# Patient Record
Sex: Male | Born: 2000 | Race: White | Hispanic: No | Marital: Single | Smoking: Current some day smoker
Health system: Southern US, Community
[De-identification: ages and names within clinical notes are randomized; demographics above are authoritative.]

## PROBLEM LIST (undated history)

## (undated) ENCOUNTER — Ambulatory Visit: Admission: EM | Source: Home / Self Care

## (undated) DIAGNOSIS — J45909 Unspecified asthma, uncomplicated: Secondary | ICD-10-CM

## (undated) HISTORY — PX: NO PAST SURGERIES: SHX2092

## (undated) HISTORY — PX: TONSILLECTOMY: SUR1361

---
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
## 2018-04-09 ENCOUNTER — Ambulatory Visit
Admission: EM | Admit: 2018-04-09 | Discharge: 2018-04-09 | Disposition: A | Discharge status: Home / Self Care | Payer: Medicaid Other | Attending: Family Medicine | Admitting: Family Medicine

## 2018-04-09 ENCOUNTER — Other Ambulatory Visit: Payer: Self-pay

## 2018-04-09 DIAGNOSIS — J988 Other specified respiratory disorders: Secondary | ICD-10-CM

## 2018-04-09 DIAGNOSIS — J069 Acute upper respiratory infection, unspecified: Secondary | ICD-10-CM | POA: Diagnosis not present

## 2018-04-09 MED ORDER — BENZONATATE 100 MG PO CAPS: 100.0000 mg | ORAL_CAPSULE | Freq: Three times a day (TID) | ORAL | 0 refills | Status: DC | PRN

## 2018-04-09 MED ORDER — PREDNISONE 50 MG PO TABS: ORAL_TABLET | ORAL | 0 refills | Status: DC

## 2018-04-09 NOTE — Discharge Instructions (Signed)
This is viral and will slowly improve.  I have placed you on Prednisone and cough medication to help your symptoms.  Rest, fluids.  Take care  Dr. Adriana Simas

## 2018-04-09 NOTE — ED Triage Notes (Signed)
Pt with 2 weeks of what he thought was allergies but not going away. Cough worse first thing in the morning and at night. Scratchy and sore throat. Clear nasal drainage mostly but occasional yellow.

## 2018-04-09 NOTE — ED Provider Notes (Signed)
[facility]    CSN: [id number] Arrival date & time: 04/09/18  8295  History   Chief Complaint Chief Complaint  Patient presents with  . Cough   HPI  17 year old male presents with cough.  Patient states that he has been sick for the past 2 weeks.  He has had cough.  Cough seems to be worsening.  Associated sore throat, postnasal drip, headache.  No fever.  He thought that allergies may be contributing.  No known relieving factors.  No other associated symptoms.  No other complaints or concerns at this time.  Past Medical History:  Diagnosis Date  . Asthma    History reviewed. No pertinent surgical history.   Home Medications    Prior to Admission medications   Medication Sig Start Date End Date Taking? Authorizing Provider  benzonatate (TESSALON) 100 MG capsule Take 1 capsule (100 mg total) by mouth 3 (three) times daily as needed. 04/09/18   Tommie Sams, DO  predniSONE (DELTASONE) 50 MG tablet 1 tablet daily x 5 days. 04/09/18   Tommie Sams, DO   Family History Family History  Problem Relation Age of Onset  . Diabetes Mother   . Stroke Father    Social History Social History   Tobacco Use  . Smoking status: Current Every Day Smoker    Packs/day: 0.50    Types: Cigarettes  . Smokeless tobacco: Former Engineer, water Use Topics  . Alcohol use: No  . Drug use: No   Allergies   Patient has no known allergies.  Review of Systems Review of Systems  Constitutional: Negative for fever.  HENT: Positive for postnasal drip.   Respiratory: Positive for cough.   Neurological: Positive for headaches.   Physical Exam Triage Vital Signs ED Triage Vitals [04/09/18 0852]  Enc Vitals Group     BP 138/80     Pulse Rate (!) 48     Resp 16     Temp 98 F (36.7 C)     Temp Source Oral     SpO2 100 %     Weight 170 lb (77.1 kg)     Height 6' (1.829 m)     Head Circumference      Peak Flow      Pain Score 5     Pain Loc      Pain Edu?      Excl. in GC?     No data found.  Updated Vital Signs BP 138/80 (BP Location: Right Arm)   Pulse (!) 48   Temp 98 F (36.7 C) (Oral)   Resp 16   Ht 6' (1.829 m)   Wt 170 lb (77.1 kg)   SpO2 100%   BMI 23.06 kg/m     Physical Exam  Constitutional: He is oriented to person, place, and time. He appears well-developed. No distress.  HENT:  Oropharynx with mild erythema.  Eyes: Conjunctivae are normal. Right eye exhibits no discharge. Left eye exhibits no discharge.  Neck: Neck supple.  Cardiovascular: Regular rhythm.  Bradycardic.  Pulmonary/Chest: Effort normal and breath sounds normal.  Lymphadenopathy:    He has no cervical adenopathy.  Neurological: He is alert and oriented to person, place, and time.  Psychiatric: He has a normal mood and affect.  Nursing note and vitals reviewed.  UC Treatments / Results  Labs (all labs ordered are listed, but only abnormal results are displayed) Labs Reviewed - No data to display  EKG None  Radiology No  results found.  Procedures Procedures (including critical care time)  Medications Ordered in UC Medications - No data to display  Initial Impression / Assessment and Plan / UC Course  I have reviewed the triage vital signs and the nursing notes.  Pertinent labs & imaging results that were available during my care of the patient were reviewed by me and considered in my medical decision making (see chart for details).    17 year old male presents with a viral respiratory infection.  Treating with prednisone and Tessalon Perles.  Final Clinical Impressions(s) / UC Diagnoses   Final diagnoses:  Respiratory infection    ED Prescriptions    Medication Sig Dispense Auth. Provider   predniSONE (DELTASONE) 50 MG tablet 1 tablet daily x 5 days. 5 tablet Carisha Kantor G, DO   benzonatate (TESSALON) 100 MG capsule Take 1 capsule (100 mg total) by mouth 3 (three) times daily as needed. 30 capsule Tommie Sams, DO     Controlled Substance  Prescriptions [place] Controlled Substance Registry consulted? Not Applicable   Tommie Sams, DO 04/09/18 1003

## 2018-05-30 ENCOUNTER — Encounter: Payer: Self-pay | Admitting: Emergency Medicine

## 2018-05-30 ENCOUNTER — Ambulatory Visit
Admission: EM | Admit: 2018-05-30 | Discharge: 2018-05-30 | Disposition: A | Discharge status: Home / Self Care | Payer: Medicaid Other | Attending: Emergency Medicine | Admitting: Emergency Medicine

## 2018-05-30 ENCOUNTER — Other Ambulatory Visit: Payer: Self-pay

## 2018-05-30 DIAGNOSIS — R509 Fever, unspecified: Secondary | ICD-10-CM | POA: Diagnosis present

## 2018-05-30 DIAGNOSIS — F17210 Nicotine dependence, cigarettes, uncomplicated: Secondary | ICD-10-CM | POA: Diagnosis not present

## 2018-05-30 DIAGNOSIS — R6889 Other general symptoms and signs: Secondary | ICD-10-CM

## 2018-05-30 DIAGNOSIS — R52 Pain, unspecified: Secondary | ICD-10-CM | POA: Diagnosis present

## 2018-05-30 DIAGNOSIS — J45909 Unspecified asthma, uncomplicated: Secondary | ICD-10-CM | POA: Insufficient documentation

## 2018-05-30 DIAGNOSIS — J111 Influenza due to unidentified influenza virus with other respiratory manifestations: Secondary | ICD-10-CM | POA: Insufficient documentation

## 2018-05-30 DIAGNOSIS — R110 Nausea: Secondary | ICD-10-CM | POA: Diagnosis not present

## 2018-05-30 DIAGNOSIS — J029 Acute pharyngitis, unspecified: Secondary | ICD-10-CM | POA: Insufficient documentation

## 2018-05-30 LAB — RAPID INFLUENZA A&B ANTIGENS (ARMC ONLY)
INFLUENZA A (ARMC): NEGATIVE
INFLUENZA B (ARMC): NEGATIVE

## 2018-05-30 LAB — RAPID STREP SCREEN (MED CTR MEBANE ONLY): Streptococcus, Group A Screen (Direct): NEGATIVE

## 2018-05-30 MED ORDER — IBUPROFEN 600 MG PO TABS: 600.0000 mg | ORAL_TABLET | Freq: Four times a day (QID) | ORAL | 0 refills | Status: DC | PRN

## 2018-05-30 MED ORDER — OSELTAMIVIR PHOSPHATE 75 MG PO CAPS: 75.0000 mg | ORAL_CAPSULE | Freq: Two times a day (BID) | ORAL | 0 refills | Status: AC

## 2018-05-30 MED ORDER — IBUPROFEN 800 MG PO TABS
800.0000 mg | ORAL_TABLET | Freq: Once | ORAL | Status: AC
  Administered 2018-05-30: 800 mg via ORAL

## 2018-05-30 NOTE — ED Provider Notes (Signed)
[facility]    CSN: [id number] Arrival date & time: 05/30/18  1136     History   Chief Complaint Chief Complaint  Patient presents with  . Fever  . Generalized Body Aches  . Sore Throat    HPI Juan Hooper is a 17 y.o. male.   17 year old male presents with fever of 101/102, body aches, chills for the past 3 days. Also c/o of sore throat and throat congestion for the past 2 days along with nausea. Denies any cough, abdominal pain or vomiting. Had a few episodes of loose stools yesterday. He has not taken any medications for symptoms. He is accompanied today by his girlfriend and her 30 year old son has also been sick with 103/104 degree fevers, cough and congestion. She took him to the ER and was dx with ?type of infection and started on Amoxicillin and Ibuprofen/Tylenol. Otherwise he has no other chronic health issues. Does smoke cigarettes daily. Takes no daily medication.   The history is provided by the patient.    Past Medical History:  Diagnosis Date  . Asthma     There are no active problems to display for this patient.   History reviewed. No pertinent surgical history.     Home Medications    Prior to Admission medications   Medication Sig Start Date End Date Taking? Authorizing Provider  ibuprofen (ADVIL,MOTRIN) 600 MG tablet Take 1 tablet (600 mg total) by mouth every 6 (six) hours as needed for fever, headache or moderate pain. 05/30/18   Sudie Grumbling, NP  oseltamivir (TAMIFLU) 75 MG capsule Take 1 capsule (75 mg total) by mouth every 12 (twelve) hours for 5 days. 05/30/18 06/04/18  Sudie Grumbling, NP    Family History Family History  Problem Relation Age of Onset  . Diabetes Mother   . Stroke Father     Social History Social History   Tobacco Use  . Smoking status: Current Every Day Smoker    Packs/day: 0.50    Types: Cigarettes  . Smokeless tobacco: Former Engineer, water Use Topics  . Alcohol use: No  . Drug use: No      Allergies   Patient has no known allergies.   Review of Systems Review of Systems  Constitutional: Positive for activity change, appetite change, chills, fatigue and fever.  HENT: Positive for congestion (mild nasal), postnasal drip, rhinorrhea and sore throat. Negative for ear discharge, ear pain, facial swelling, hearing loss, mouth sores, nosebleeds, sinus pressure, sinus pain, sneezing and trouble swallowing.   Eyes: Negative for photophobia, pain, discharge, redness, itching and visual disturbance.  Respiratory: Negative for cough, chest tightness, shortness of breath and wheezing.   Gastrointestinal: Positive for diarrhea and nausea. Negative for abdominal pain and vomiting.  Genitourinary: Negative for decreased urine volume, difficulty urinating and dysuria.  Musculoskeletal: Positive for arthralgias, back pain, myalgias and neck pain. Negative for neck stiffness.  Skin: Negative for color change, rash and wound.  Allergic/Immunologic: Negative for immunocompromised state.  Neurological: Positive for headaches. Negative for dizziness, tremors, seizures, syncope, weakness, light-headedness and numbness.  Hematological: Negative for adenopathy. Does not bruise/bleed easily.     Physical Exam Triage Vital Signs ED Triage Vitals  Enc Vitals Group     BP 05/30/18 1203 123/84     Pulse Rate 05/30/18 1203 99     Resp 05/30/18 1203 16     Temp 05/30/18 1203 (!) 101 F (38.3 C)     Temp  Source 05/30/18 1203 Oral     SpO2 05/30/18 1203 100 %     Weight 05/30/18 1202 170 lb (77.1 kg)     Height 05/30/18 1202 6' (1.829 m)     Head Circumference --      Peak Flow --      Pain Score 05/30/18 1202 10     Pain Loc --      Pain Edu? --      Excl. in GC? --    No data found.  Updated Vital Signs BP 123/84 (BP Location: Left Arm)   Pulse 99   Temp (!) 101 F (38.3 C) (Oral)   Resp 16   Ht 6' (1.829 m)   Wt 170 lb (77.1 kg)   SpO2 100%   BMI 23.06 kg/m   Visual  Acuity Right Eye Distance:   Left Eye Distance:   Bilateral Distance:    Right Eye Near:   Left Eye Near:    Bilateral Near:     Physical Exam  Constitutional: He is oriented to person, place, and time. He appears well-developed and well-nourished. He appears ill. No distress.  Patient lying on exam table with girlfriend lying next to him in no acute distress but appears ill.   HENT:  Head: Normocephalic and atraumatic.  Right Ear: Hearing, tympanic membrane, external ear and ear canal normal.  Left Ear: Hearing, tympanic membrane, external ear and ear canal normal.  Nose: Rhinorrhea present. No mucosal edema. Right sinus exhibits no maxillary sinus tenderness and no frontal sinus tenderness. Left sinus exhibits no maxillary sinus tenderness and no frontal sinus tenderness.  Mouth/Throat: Uvula is midline and mucous membranes are normal. Oropharyngeal exudate (slight white to yellow drainage) and posterior oropharyngeal erythema present.  Eyes: Conjunctivae and EOM are normal.  Neck: Normal range of motion. Neck supple.  Cardiovascular: Normal rate, regular rhythm and normal heart sounds.  No murmur heard. Pulmonary/Chest: Effort normal and breath sounds normal. No respiratory distress. He has no decreased breath sounds. He has no wheezes. He has no rhonchi. He has no rales.  Abdominal: Soft. Normal appearance and bowel sounds are normal. He exhibits no distension and no mass. There is no hepatosplenomegaly. There is no tenderness.  Musculoskeletal: Normal range of motion.  Lymphadenopathy:    He has cervical adenopathy.       Right cervical: Superficial cervical adenopathy present.       Left cervical: Superficial cervical adenopathy present.  Neurological: He is alert and oriented to person, place, and time.  Skin: Skin is warm and dry. Capillary refill takes less than 2 seconds. No rash noted.  Psychiatric: He has a normal mood and affect. His speech is normal. Thought content  normal. He expresses inappropriate judgment.  Vitals reviewed.    UC Treatments / Results  Labs (all labs ordered are listed, but only abnormal results are displayed) Labs Reviewed  RAPID STREP SCREEN ([facility] ONLY)  RAPID INFLUENZA A&B ANTIGENS ([facility] ONLY)  CULTURE, GROUP A STREP [facility])    EKG None  Radiology No results found.  Procedures Procedures (including critical care time)  Medications Ordered in UC Medications  ibuprofen (ADVIL,MOTRIN) tablet 800 mg (800 mg Oral Given 05/30/18 1207)    Initial Impression / Assessment and Plan / UC Course  I have reviewed the triage vital signs and the nursing notes.  Pertinent labs & imaging results that were available during my care of the patient were reviewed by me and considered in my  medical decision making (see chart for details).    Reviewed negative rapid flu test and strep test with patient and girlfriend. Discussed that he may still have Influenza or flu-like illness. Reviewed benefits, risks with Tamiflu and patient desires to trial medication. Start Tamiflu 75mg  twice a day as directed. Continue Ibuprofen 600mg  every 6 hours as needed for fever and body aches. Continue to push fluids and eat frequent small meals. Note written for work. Follow-up with his PCP in 4 to 5 days if not improving.  Final Clinical Impressions(s) / UC Diagnoses   Final diagnoses:  Flu-like symptoms  Pharyngitis, unspecified etiology     Discharge Instructions     Recommend start Tamiflu 75mg  twice a day as directed. Continue Ibuprofen 600mg  every 6 hours as needed for pain and fever. Continue to push fluids and eat frequent small meals. Follow-up pending lab results and with your PCP in 4 to 5 days if not improving.     ED Prescriptions    Medication Sig Dispense Auth. Provider   oseltamivir (TAMIFLU) 75 MG capsule Take 1 capsule (75 mg total) by mouth every 12 (twelve) hours for 5 days. 10 capsule Sudie Grumbling, NP   ibuprofen  (ADVIL,MOTRIN) 600 MG tablet Take 1 tablet (600 mg total) by mouth every 6 (six) hours as needed for fever, headache or moderate pain. 20 tablet Sudie Grumbling, NP     Controlled Substance Prescriptions [place] Controlled Substance Registry consulted? Not Applicable   Sudie Grumbling, NP 05/30/18 2114

## 2018-05-30 NOTE — Discharge Instructions (Signed)
Recommend start Tamiflu 75mg  twice a day as directed. Continue Ibuprofen 600mg  every 6 hours as needed for pain and fever. Continue to push fluids and eat frequent small meals. Follow-up pending lab results and with your PCP in 4 to 5 days if not improving.

## 2018-05-30 NOTE — ED Triage Notes (Signed)
Patient c/o sore throat, fever and bodyaches that started 2 days ago.

## 2018-06-02 LAB — CULTURE, GROUP A STREP (THRC)

## 2018-06-07 ENCOUNTER — Other Ambulatory Visit: Payer: Self-pay

## 2018-06-07 ENCOUNTER — Encounter: Payer: Self-pay | Admitting: Physician Assistant

## 2018-06-07 ENCOUNTER — Emergency Department
Admission: EM | Admit: 2018-06-07 | Discharge: 2018-06-07 | Disposition: A | Discharge status: Home / Self Care | Payer: Medicaid Other | Attending: Emergency Medicine | Admitting: Emergency Medicine

## 2018-06-07 ENCOUNTER — Emergency Department: Payer: Medicaid Other

## 2018-06-07 DIAGNOSIS — W2209XA Striking against other stationary object, initial encounter: Secondary | ICD-10-CM | POA: Insufficient documentation

## 2018-06-07 DIAGNOSIS — Y9389 Activity, other specified: Secondary | ICD-10-CM | POA: Diagnosis not present

## 2018-06-07 DIAGNOSIS — Y929 Unspecified place or not applicable: Secondary | ICD-10-CM | POA: Insufficient documentation

## 2018-06-07 DIAGNOSIS — J45909 Unspecified asthma, uncomplicated: Secondary | ICD-10-CM | POA: Insufficient documentation

## 2018-06-07 DIAGNOSIS — S6991XA Unspecified injury of right wrist, hand and finger(s), initial encounter: Secondary | ICD-10-CM | POA: Diagnosis present

## 2018-06-07 DIAGNOSIS — Y999 Unspecified external cause status: Secondary | ICD-10-CM | POA: Diagnosis not present

## 2018-06-07 DIAGNOSIS — S62304A Unspecified fracture of fourth metacarpal bone, right hand, initial encounter for closed fracture: Secondary | ICD-10-CM | POA: Diagnosis not present

## 2018-06-07 DIAGNOSIS — F17210 Nicotine dependence, cigarettes, uncomplicated: Secondary | ICD-10-CM | POA: Diagnosis not present

## 2018-06-07 DIAGNOSIS — S62325A Displaced fracture of shaft of fourth metacarpal bone, left hand, initial encounter for closed fracture: Secondary | ICD-10-CM | POA: Diagnosis not present

## 2018-06-07 MED ORDER — TRAMADOL HCL 50 MG PO TABS
50.0000 mg | ORAL_TABLET | Freq: Once | ORAL | Status: AC
  Administered 2018-06-07: 50 mg via ORAL

## 2018-06-07 MED ORDER — TRAMADOL HCL 50 MG PO TABS
ORAL_TABLET | ORAL | Status: AC
  Administered 2018-06-07: 50 mg via ORAL
  Filled 2018-06-07: qty 1

## 2018-06-07 MED ORDER — TRAMADOL HCL 50 MG PO TABS: 50.0000 mg | ORAL_TABLET | Freq: Four times a day (QID) | ORAL | 0 refills | Status: DC | PRN

## 2018-06-07 NOTE — ED Provider Notes (Signed)
[facility] Emergency Department Provider Note  ____________________________________________   First MD Initiated Contact with Patient 06/07/18 2044     (approximate)  I have reviewed the triage vital signs and the nursing notes.   HISTORY  Chief Complaint Hand Pain    HPI Juan Hooper is a 17 y.o. male presents emergency department complaint of left hand pain.  He states he got mad when his girlfriend told him she was going to have an abortion and punched a pole.  He is left-handed.  He has no other complaints.  Past Medical History:  Diagnosis Date  . Asthma     There are no active problems to display for this patient.   History reviewed. No pertinent surgical history.  Prior to Admission medications   Medication Sig Start Date End Date Taking? Authorizing Provider  ibuprofen (ADVIL,MOTRIN) 600 MG tablet Take 1 tablet (600 mg total) by mouth every 6 (six) hours as needed for fever, headache or moderate pain. 05/30/18   Sudie GrumblingAmyot, Ann Berry, NP  traMADol (ULTRAM) 50 MG tablet Take 1 tablet (50 mg total) by mouth every 6 (six) hours as needed. 06/07/18   Faythe GheeFisher, Susan W, PA-C    Allergies Patient has no known allergies.  Family History  Problem Relation Age of Onset  . Diabetes Mother   . Stroke Father     Social History Social History   Tobacco Use  . Smoking status: Current Every Day Smoker    Packs/day: 0.50    Types: Cigarettes  . Smokeless tobacco: Former Engineer, waterUser  Substance Use Topics  . Alcohol use: No  . Drug use: No    Review of Systems  Constitutional: No fever/chills Eyes: No visual changes. ENT: No sore throat. Respiratory: Denies cough Genitourinary: Negative for dysuria. Musculoskeletal: Negative for back pain.  Positive for left hand pain Skin: Negative for rash.    ____________________________________________   PHYSICAL EXAM:  VITAL SIGNS: ED Triage Vitals  Enc Vitals Group     BP 06/07/18 2046 (!) 146/79       Pulse Rate 06/07/18 2046 86     Resp 06/07/18 2046 18     Temp 06/07/18 2046 98.1 F (36.7 C)     Temp Source 06/07/18 2046 Oral     SpO2 06/07/18 2046 99 %     Weight 06/07/18 2047 175 lb (79.4 kg)     Height 06/07/18 2047 6' (1.829 m)     Head Circumference --      Peak Flow --      Pain Score 06/07/18 2047 10     Pain Loc --      Pain Edu? --      Excl. in GC? --     Constitutional: Alert and oriented. Well appearing and in no acute distress. Eyes: Conjunctivae are normal.  Head: Atraumatic. Nose: No congestion/rhinnorhea. Mouth/Throat: Mucous membranes are moist.   Cardiovascular: Normal rate, regular rhythm. Respiratory: Normal respiratory effort.  No retractions GU: deferred Musculoskeletal: The left hand is tender and swollen at the fourth and fifth metacarpals.  There is a lot of swelling and questionable deformity in same area.  Patient is able to move all fingers and is neurovascularly intact.  Skin is intact also. Neurologic:  Normal speech and language.  Skin:  Skin is warm, dry and intact. No rash noted. Psychiatric: Mood and affect are normal. Speech and behavior are normal.  ____________________________________________   LABS (all labs ordered are listed, but only abnormal  results are displayed)  Labs Reviewed - No data to display ____________________________________________   ____________________________________________  RADIOLOGY  X-ray of the left hand shows a fourth metacarpal fracture which is displaced and angulated at 60 degrees  ____________________________________________   PROCEDURES  Procedure(s) performed:   .Splint Application Date/Time: 06/07/2018 9:35 PM Performed by: Alva Garnet, NT Authorized by: Faythe Ghee, PA-C   Consent:    Consent obtained:  Verbal   Consent given by:  Patient   Risks discussed:  Discoloration, numbness, pain and swelling   Alternatives discussed:  No treatment Pre-procedure details:     Sensation:  Normal Procedure details:    Laterality:  Left   Location:  Hand   Hand:  L hand   Splint type:  Ulnar gutter   Supplies:  Ortho-Glass Post-procedure details:    Pain:  Unchanged   Sensation:  Normal   Patient tolerance of procedure:  Tolerated well, no immediate complications      ____________________________________________   INITIAL IMPRESSION / ASSESSMENT AND PLAN / ED COURSE  Pertinent labs & imaging results that were available during my care of the patient were reviewed by me and considered in my medical decision making (see chart for details).  Patient is 17 year old male presents emergency department complaining of left hand pain after punching a pole.  On physical exam the left hand is tender and swollen with questionable deformity around the fourth and fifth metacarpal.  X-ray of the left hand shows a fourth metacarpal fracture which is angulated and displaced by 60 degrees  Discussed the x-ray findings with patient.  He was put in a ulnar gutter OCL.  He is to call Dr. Eliane Decree office tomorrow for an appointment.  He was given tramadol 50 mg p.o. while in the ED for pain.  He is to take Tylenol and ibuprofen for pain and tramadol for pain is not controlled by this.  He is to elevate and ice the extremity.  He states he understands comply with our instructions.  Was discharged in stable condition with this mother.     As part of my medical decision making, I reviewed the following data within the electronic MEDICAL RECORD NUMBER Nursing notes reviewed and incorporated, Old chart reviewed, Radiograph reviewed x-ray of the left hand shows a metacarpal fracture at the fourth metacarpal, Notes from prior ED visits and [place] Controlled Substance Database  ____________________________________________   FINAL CLINICAL IMPRESSION(S) / ED DIAGNOSES  Final diagnoses:  Closed displaced fracture of fourth metacarpal bone of right hand, unspecified portion of metacarpal,  initial encounter      NEW MEDICATIONS STARTED DURING THIS VISIT:  New Prescriptions   TRAMADOL (ULTRAM) 50 MG TABLET    Take 1 tablet (50 mg total) by mouth every 6 (six) hours as needed.     Note:  This document was prepared using Dragon voice recognition software and may include unintentional dictation errors.    Faythe Ghee, PA-C 06/07/18 2136    Sharyn Creamer, MD 06/08/18 [phone]

## 2018-06-07 NOTE — ED Triage Notes (Signed)
Punched a pole with his left hand. Swollen and painful. CMS intact.

## 2018-06-07 NOTE — Discharge Instructions (Addendum)
Follow-up with Dr. Allena KatzPatel.  Call his office for an appointment.  Tell them you were seen in the emergency department and you have a fourth metacarpal fracture which is displaced.  Take the pain medication of Tylenol and ibuprofen are not helping.  Keep the hand elevated and iced as much as possible.  Return to the emergency department if worsening

## 2018-06-12 DIAGNOSIS — S62325A Displaced fracture of shaft of fourth metacarpal bone, left hand, initial encounter for closed fracture: Secondary | ICD-10-CM | POA: Diagnosis not present

## 2018-06-14 ENCOUNTER — Encounter: Payer: Self-pay | Admitting: *Deleted

## 2018-06-14 ENCOUNTER — Other Ambulatory Visit: Payer: Self-pay

## 2018-06-14 ENCOUNTER — Ambulatory Visit: Payer: Medicaid Other | Admitting: Anesthesiology

## 2018-06-14 ENCOUNTER — Ambulatory Visit
Admission: RE | Admit: 2018-06-14 | Discharge: 2018-06-14 | Disposition: A | Discharge status: Home / Self Care | Payer: Medicaid Other | Source: Ambulatory Visit | Attending: Orthopedic Surgery | Admitting: Orthopedic Surgery

## 2018-06-14 ENCOUNTER — Ambulatory Visit: Payer: Medicaid Other

## 2018-06-14 ENCOUNTER — Encounter
Admission: RE | Disposition: A | Discharge status: Home / Self Care | Payer: Self-pay | Source: Ambulatory Visit | Attending: Orthopedic Surgery

## 2018-06-14 DIAGNOSIS — J45909 Unspecified asthma, uncomplicated: Secondary | ICD-10-CM | POA: Insufficient documentation

## 2018-06-14 DIAGNOSIS — S62325A Displaced fracture of shaft of fourth metacarpal bone, left hand, initial encounter for closed fracture: Secondary | ICD-10-CM | POA: Insufficient documentation

## 2018-06-14 DIAGNOSIS — F319 Bipolar disorder, unspecified: Secondary | ICD-10-CM | POA: Diagnosis not present

## 2018-06-14 DIAGNOSIS — Z79899 Other long term (current) drug therapy: Secondary | ICD-10-CM | POA: Insufficient documentation

## 2018-06-14 DIAGNOSIS — Z8781 Personal history of (healed) traumatic fracture: Secondary | ICD-10-CM

## 2018-06-14 DIAGNOSIS — W2189XA Striking against or struck by other sports equipment, initial encounter: Secondary | ICD-10-CM | POA: Insufficient documentation

## 2018-06-14 DIAGNOSIS — F909 Attention-deficit hyperactivity disorder, unspecified type: Secondary | ICD-10-CM | POA: Diagnosis not present

## 2018-06-14 DIAGNOSIS — Y9367 Activity, basketball: Secondary | ICD-10-CM | POA: Diagnosis not present

## 2018-06-14 DIAGNOSIS — Z9889 Other specified postprocedural states: Secondary | ICD-10-CM

## 2018-06-14 DIAGNOSIS — M79642 Pain in left hand: Secondary | ICD-10-CM | POA: Diagnosis not present

## 2018-06-14 HISTORY — PX: OPEN REDUCTION INTERNAL FIXATION (ORIF) METACARPAL: SHX6234

## 2018-06-14 SURGERY — OPEN REDUCTION INTERNAL FIXATION (ORIF) METACARPAL
Anesthesia: General | Laterality: Left

## 2018-06-14 MED ORDER — ACETAMINOPHEN 10 MG/ML IV SOLN
INTRAVENOUS | Status: DC | PRN
  Administered 2018-06-14: 1000 mg via INTRAVENOUS

## 2018-06-14 MED ORDER — MIDAZOLAM HCL 2 MG/2ML IJ SOLN
INTRAMUSCULAR | Status: AC
  Filled 2018-06-14: qty 2

## 2018-06-14 MED ORDER — HYDROCODONE-ACETAMINOPHEN 5-325 MG PO TABS
1.0000 | ORAL_TABLET | ORAL | Status: DC | PRN
  Administered 2018-06-14: 1 via ORAL

## 2018-06-14 MED ORDER — ONDANSETRON HCL 4 MG/2ML IJ SOLN: 4.0000 mg | Freq: Four times a day (QID) | INTRAMUSCULAR | Status: DC | PRN

## 2018-06-14 MED ORDER — ONDANSETRON HCL 4 MG PO TABS: 4.0000 mg | ORAL_TABLET | Freq: Four times a day (QID) | ORAL | Status: DC | PRN

## 2018-06-14 MED ORDER — ACETAMINOPHEN 10 MG/ML IV SOLN
INTRAVENOUS | Status: AC
  Filled 2018-06-14: qty 100

## 2018-06-14 MED ORDER — BUPIVACAINE HCL (PF) 0.5 % IJ SOLN
INTRAMUSCULAR | Status: DC | PRN
  Administered 2018-06-14: 10 mL

## 2018-06-14 MED ORDER — GLYCOPYRROLATE 0.2 MG/ML IJ SOLN
INTRAMUSCULAR | Status: AC
  Filled 2018-06-14: qty 1

## 2018-06-14 MED ORDER — LIDOCAINE HCL (PF) 2 % IJ SOLN
INTRAMUSCULAR | Status: AC
  Filled 2018-06-14: qty 10

## 2018-06-14 MED ORDER — HYDROCODONE-ACETAMINOPHEN 5-325 MG PO TABS
ORAL_TABLET | ORAL | Status: AC
  Filled 2018-06-14: qty 1

## 2018-06-14 MED ORDER — DEXAMETHASONE SODIUM PHOSPHATE 10 MG/ML IJ SOLN
INTRAMUSCULAR | Status: AC
  Filled 2018-06-14: qty 1

## 2018-06-14 MED ORDER — FENTANYL CITRATE (PF) 100 MCG/2ML IJ SOLN
INTRAMUSCULAR | Status: AC
  Filled 2018-06-14: qty 2

## 2018-06-14 MED ORDER — BUPIVACAINE HCL (PF) 0.5 % IJ SOLN
INTRAMUSCULAR | Status: AC
  Filled 2018-06-14: qty 30

## 2018-06-14 MED ORDER — DEXAMETHASONE SODIUM PHOSPHATE 10 MG/ML IJ SOLN
INTRAMUSCULAR | Status: DC | PRN
  Administered 2018-06-14 (×2): 10 mg via INTRAVENOUS

## 2018-06-14 MED ORDER — FENTANYL CITRATE (PF) 100 MCG/2ML IJ SOLN
INTRAMUSCULAR | Status: AC
  Administered 2018-06-14: 25 ug via INTRAVENOUS
  Filled 2018-06-14: qty 2

## 2018-06-14 MED ORDER — ONDANSETRON HCL 4 MG/2ML IJ SOLN: 4.0000 mg | Freq: Once | INTRAMUSCULAR | Status: DC | PRN

## 2018-06-14 MED ORDER — CEFAZOLIN SODIUM-DEXTROSE 2-4 GM/100ML-% IV SOLN
INTRAVENOUS | Status: AC
  Filled 2018-06-14: qty 100

## 2018-06-14 MED ORDER — LACTATED RINGERS IV SOLN
INTRAVENOUS | Status: DC
  Administered 2018-06-14: 10:00:00 via INTRAVENOUS

## 2018-06-14 MED ORDER — METOCLOPRAMIDE HCL 5 MG/ML IJ SOLN: 5.0000 mg | Freq: Three times a day (TID) | INTRAMUSCULAR | Status: DC | PRN

## 2018-06-14 MED ORDER — MIDAZOLAM HCL 2 MG/2ML IJ SOLN
INTRAMUSCULAR | Status: DC | PRN
  Administered 2018-06-14: 2 mg via INTRAVENOUS

## 2018-06-14 MED ORDER — PROPOFOL 10 MG/ML IV BOLUS
INTRAVENOUS | Status: DC | PRN
  Administered 2018-06-14: 200 mg via INTRAVENOUS

## 2018-06-14 MED ORDER — ONDANSETRON HCL 4 MG/2ML IJ SOLN
INTRAMUSCULAR | Status: AC
  Filled 2018-06-14: qty 2

## 2018-06-14 MED ORDER — ONDANSETRON HCL 4 MG/2ML IJ SOLN
INTRAMUSCULAR | Status: DC | PRN
  Administered 2018-06-14 (×2): 4 mg via INTRAVENOUS

## 2018-06-14 MED ORDER — CEFAZOLIN SODIUM-DEXTROSE 2-4 GM/100ML-% IV SOLN
2.0000 g | Freq: Once | INTRAVENOUS | Status: AC
  Administered 2018-06-14: 2 g via INTRAVENOUS

## 2018-06-14 MED ORDER — METOCLOPRAMIDE HCL 10 MG PO TABS: 5.0000 mg | ORAL_TABLET | Freq: Three times a day (TID) | ORAL | Status: DC | PRN

## 2018-06-14 MED ORDER — HYDROCODONE-ACETAMINOPHEN 5-325 MG PO TABS: 1.0000 | ORAL_TABLET | Freq: Four times a day (QID) | ORAL | 0 refills | Status: DC | PRN

## 2018-06-14 MED ORDER — FENTANYL CITRATE (PF) 100 MCG/2ML IJ SOLN
25.0000 ug | INTRAMUSCULAR | Status: DC | PRN
  Administered 2018-06-14 (×2): 25 ug via INTRAVENOUS

## 2018-06-14 MED ORDER — NEOMYCIN-POLYMYXIN B GU 40-200000 IR SOLN
Status: DC | PRN
  Administered 2018-06-14: 2 mL

## 2018-06-14 MED ORDER — LIDOCAINE HCL (CARDIAC) PF 100 MG/5ML IV SOSY
PREFILLED_SYRINGE | INTRAVENOUS | Status: DC | PRN
  Administered 2018-06-14: 100 mg via INTRAVENOUS

## 2018-06-14 MED ORDER — PHENYLEPHRINE HCL 10 MG/ML IJ SOLN
INTRAMUSCULAR | Status: DC | PRN
  Administered 2018-06-14 (×2): 100 ug via INTRAVENOUS

## 2018-06-14 MED ORDER — SODIUM CHLORIDE 0.9 % IV SOLN: INTRAVENOUS | Status: DC

## 2018-06-14 MED ORDER — PROPOFOL 500 MG/50ML IV EMUL
INTRAVENOUS | Status: AC
  Filled 2018-06-14: qty 50

## 2018-06-14 MED ORDER — KETOROLAC TROMETHAMINE 30 MG/ML IJ SOLN
INTRAMUSCULAR | Status: DC | PRN
  Administered 2018-06-14: 30 mg via INTRAVENOUS

## 2018-06-14 MED ORDER — NEOMYCIN-POLYMYXIN B GU 40-200000 IR SOLN
Status: AC
  Filled 2018-06-14: qty 2

## 2018-06-14 MED ORDER — FENTANYL CITRATE (PF) 100 MCG/2ML IJ SOLN
INTRAMUSCULAR | Status: DC | PRN
  Administered 2018-06-14: 100 ug via INTRAVENOUS

## 2018-06-14 SURGICAL SUPPLY — 39 items
BANDAGE ELASTIC 4 LF NS (GAUZE/BANDAGES/DRESSINGS) ×3 IMPLANT
BIT DRILL 1.1 (BIT) ×1
BIT DRILL 1.1MM (BIT) ×1
BIT DRILL 60X20X1.1XQC TMX (BIT) IMPLANT
BIT DRL 60X20X1.1XQC TMX (BIT) ×1
BNDG GAUZE 1X2.1 STRL (MISCELLANEOUS) ×3 IMPLANT
CHLORAPREP W/TINT 26ML (MISCELLANEOUS) ×3 IMPLANT
CUFF TOURN 18 STER (MISCELLANEOUS) IMPLANT
DRAPE FLUOR MINI C-ARM 54X84 (DRAPES) ×3 IMPLANT
DRIVER BIT 1.5 (TRAUMA) ×4 IMPLANT
ELECT CAUTERY BLADE 6.4 (BLADE) ×3 IMPLANT
ELECT REM PT RETURN 9FT ADLT (ELECTROSURGICAL) ×3
ELECTRODE REM PT RTRN 9FT ADLT (ELECTROSURGICAL) ×1 IMPLANT
GAUZE PETRO XEROFOAM 1X8 (MISCELLANEOUS) ×3 IMPLANT
GAUZE SPONGE 4X4 12PLY STRL (GAUZE/BANDAGES/DRESSINGS) ×3 IMPLANT
GAUZE XEROFORM 4X4 STRL (GAUZE/BANDAGES/DRESSINGS) ×2 IMPLANT
GLOVE SURG SYN 9.0  PF PI (GLOVE) ×2
GLOVE SURG SYN 9.0 PF PI (GLOVE) ×1 IMPLANT
GOWN SRG 2XL LVL 4 RGLN SLV (GOWNS) ×1 IMPLANT
GOWN STRL NON-REIN 2XL LVL4 (GOWNS) ×2
GOWN STRL REUS W/ TWL LRG LVL3 (GOWN DISPOSABLE) ×1 IMPLANT
GOWN STRL REUS W/TWL LRG LVL3 (GOWN DISPOSABLE) ×2
K-WIRE DBL TRONS .035X6 (WIRE) ×3
KIT TURNOVER KIT A (KITS) ×3 IMPLANT
KWIRE DBL TRONS .035X6 (WIRE) IMPLANT
NDL FILTER BLUNT 18X1 1/2 (NEEDLE) ×1 IMPLANT
NEEDLE FILTER BLUNT 18X 1/2SAF (NEEDLE) ×2
NEEDLE FILTER BLUNT 18X1 1/2 (NEEDLE) ×1 IMPLANT
NS IRRIG 500ML POUR BTL (IV SOLUTION) ×3 IMPLANT
PACK EXTREMITY ARMC (MISCELLANEOUS) ×3 IMPLANT
PAD PREP 24X41 OB/GYN DISP (PERSONAL CARE ITEMS) ×3 IMPLANT
PADDING CAST BLEND 4X4 NS (MISCELLANEOUS) ×3 IMPLANT
PLATE STRAIGHT LOCK 1.5 (Plate) ×2 IMPLANT
SCALPEL PROTECTED #15 DISP (BLADE) ×6 IMPLANT
SCREW NL 1.5X11 WRIST (Screw) ×6 IMPLANT
SCREW NL 1.5X12 (Screw) ×2 IMPLANT
SCREW NONIOC 1.5 10M (Screw) ×4 IMPLANT
SUT ETHIBOND 4-0 (SUTURE) ×3 IMPLANT
SUT ETHILON 5 0 CL P 3 (SUTURE) ×3 IMPLANT

## 2018-06-14 NOTE — Anesthesia Preprocedure Evaluation (Signed)
Anesthesia Evaluation  Patient identified by MRN, date of birth, ID band Patient awake    Reviewed: Allergy & Precautions, NPO status , Patient's Chart, lab work & pertinent test results  History of Anesthesia Complications Negative for: history of anesthetic complications  Airway Mallampati: II       Dental   Pulmonary asthma (hx) , neg sleep apnea, neg COPD, Current Smoker,           Cardiovascular (-) hypertension(-) Past MI and (-) CHF (-) dysrhythmias (-) Valvular Problems/Murmurs     Neuro/Psych neg Seizures    GI/Hepatic Neg liver ROS, neg GERD  ,  Endo/Other  neg diabetes  Renal/GU negative Renal ROS     Musculoskeletal   Abdominal   Peds  Hematology   Anesthesia Other Findings   Reproductive/Obstetrics                             Anesthesia Physical Anesthesia Plan  ASA: II  Anesthesia Plan: General   Post-op Pain Management:    Induction: Intravenous  PONV Risk Score and Plan: 1 and Ondansetron  Airway Management Planned: LMA  Additional Equipment:   Intra-op Plan:   Post-operative Plan:   Informed Consent: I have reviewed the patients History and Physical, chart, labs and discussed the procedure including the risks, benefits and alternatives for the proposed anesthesia with the patient or authorized representative who has indicated his/her understanding and acceptance.     Plan Discussed with:   Anesthesia Plan Comments:         Anesthesia Quick Evaluation

## 2018-06-14 NOTE — Transfer of Care (Signed)
Immediate Anesthesia Transfer of Care Note  Patient: Juan Hooper  Procedure(s) Performed: OPEN REDUCTION INTERNAL FIXATION (ORIF) METACARPAL (Left )  Patient Location: PACU  Anesthesia Type:General  Level of Consciousness: awake, alert  and oriented  Airway & Oxygen Therapy: Patient Spontanous Breathing and Patient connected to face mask oxygen  Post-op Assessment: Report given to RN and Post -op Vital signs reviewed and stable  Post vital signs: Reviewed and stable  Last Vitals:  Vitals Value Taken Time  BP    Temp    Pulse 55 06/14/2018 12:56 PM  Resp    SpO2 100 % 06/14/2018 12:56 PM  Vitals shown include unvalidated device data.  Last Pain:  Vitals:   06/14/18 0934  TempSrc: Temporal  PainSc: 6          Complications: No apparent anesthesia complications

## 2018-06-14 NOTE — Anesthesia Post-op Follow-up Note (Signed)
Anesthesia QCDR form completed.        

## 2018-06-14 NOTE — Discharge Instructions (Addendum)
Work on finger range of motion but do not try to lift with the left hand.  Keep dressing clean and dry.  Keep hand elevated as much as possible.    AMBULATORY SURGERY  DISCHARGE INSTRUCTIONS   1) The drugs that you were given will stay in your system until tomorrow so for the next 24 hours you should not:  A) Drive an automobile B) Make any legal decisions C) Drink any alcoholic beverage   2) You may resume regular meals tomorrow.  Today it is better to start with liquids and gradually work up to solid foods.  You may eat anything you prefer, but it is better to start with liquids, then soup and crackers, and gradually work up to solid foods.   3) Please notify your doctor immediately if you have any unusual bleeding, trouble breathing, redness and pain at the surgery site, drainage, fever, or pain not relieved by medication.    4) Additional Instructions:        Please contact your physician with any problems or Same Day Surgery at [phone], Monday through Friday 6 am to 4 pm, or [place] at [facility] Main number at [phone].

## 2018-06-14 NOTE — Anesthesia Postprocedure Evaluation (Signed)
Anesthesia Post Note  Patient: Harland DingwallCody L Haverstock  Procedure(s) Performed: OPEN REDUCTION INTERNAL FIXATION (ORIF) METACARPAL (Left )  Patient location during evaluation: PACU Anesthesia Type: General Level of consciousness: awake and alert Pain management: pain level controlled Vital Signs Assessment: post-procedure vital signs reviewed and stable Respiratory status: spontaneous breathing and respiratory function stable Cardiovascular status: stable Anesthetic complications: no     Last Vitals:  Vitals:   06/14/18 1358 06/14/18 1420  BP: 120/75 122/78  Pulse:    Resp:    Temp: 36.5 C   SpO2:      Last Pain:  Vitals:   06/14/18 1425  TempSrc:   PainSc: 3                  KEPHART,WILLIAM K

## 2018-06-14 NOTE — Op Note (Signed)
06/14/2018  12:55 PM  PATIENT:  Juan Hooper  17 y.o. male  PRE-OPERATIVE DIAGNOSIS:  CLOSED DISPLACED FRACTUR EOF SHAFT OF FOURTH METACARPAL  POST-OPERATIVE DIAGNOSIS:  CLOSED DISPLACED FRACTUR EOF SHAFT OF FOURTH METACARPAL  PROCEDURE:  Procedure(s): OPEN REDUCTION INTERNAL FIXATION (ORIF) METACARPAL (Left)  SURGEON: Leitha SchullerMichael J Lougenia Morrissey, MD  ASSISTANTS: None  ANESTHESIA:   general  EBL:  Total I/O In: 600 [I.V.:600] Out: 5 [Blood:5]  BLOOD ADMINISTERED:none  DRAINS: none   LOCAL MEDICATIONS USED:  MARCAINE     SPECIMEN:  No Specimen  DISPOSITION OF SPECIMEN:  N/A  COUNTS:  YES  TOURNIQUET:  * Missing tourniquet times found for documented tourniquets in log: [id number] * 38 minutes at 250 mmHg  IMPLANTS: Biomet 1.5 mm plate with 6 screws, nonlocking  DICTATION: .Dragon Dictation patient brought the operating room and after adequate general anesthesia was obtained the left arm was prepped and draped you sterile fashion.  After patient identification and timeout procedure were completed tourniquet was raised.  Incision was made between the fourth and fifth metacarpals and the extensor tendon identified over the ring metacarpal and retracted radially.  The fracture site was identified and a subperiosteal dissection carried out to expose the fracture.  A 6-hole plate straight was then laid over the dorsum of this and proximal screws were filled 3 distal screws were placed but there seem to be a slight rotational deformity so the screws were removed with rotation reset.  Anatomic alignment was obtained with a straight transverse fracture some compression was applied to the fracture site.  After all screws were placed the position of the screws and plate were reviewed under the mini C-arm and appeared appropriate with appropriate screw length.  There did not appear to be rotatory or angulatory deformity to the metacarpal.  Wound was thoroughly irrigated and 10 cc of Marcaine was  infiltrated in the area the incision wound closed with simple interrupted 4-0 nylon skin sutures.  Xeroform 4 x 4 web roll and Ace wrap applied.  Get let down to close the case  PLAN OF CARE: Discharge to home after PACU  PATIENT DISPOSITION:  PACU - hemodynamically stable.

## 2018-06-14 NOTE — OR Nursing (Signed)
Discussed discharge instructions with pt and Mom. Both voice understanding. 

## 2018-06-14 NOTE — Anesthesia Procedure Notes (Signed)
Procedure Name: LMA Insertion Date/Time: 06/14/2018 11:55 AM Performed by: Junious SilkNoles, Kainen Struckman, CRNA Pre-anesthesia Checklist: Patient identified, Patient being monitored, Timeout performed, Emergency Drugs available and Suction available Patient Re-evaluated:Patient Re-evaluated prior to induction Oxygen Delivery Method: Circle system utilized Preoxygenation: Pre-oxygenation with 100% oxygen Induction Type: IV induction Ventilation: Mask ventilation without difficulty LMA: LMA inserted LMA Size: 4.0 Tube type: Oral Number of attempts: 1 Placement Confirmation: positive ETCO2 and breath sounds checked- equal and bilateral Tube secured with: Tape Dental Injury: Teeth and Oropharynx as per pre-operative assessment

## 2018-06-14 NOTE — H&P (Signed)
Reviewed paper H+P, will be scanned into chart. No changes noted.  

## 2018-06-15 ENCOUNTER — Encounter: Payer: Self-pay | Admitting: Orthopedic Surgery

## 2018-06-18 DIAGNOSIS — M25532 Pain in left wrist: Secondary | ICD-10-CM | POA: Diagnosis not present

## 2018-06-18 DIAGNOSIS — Z967 Presence of other bone and tendon implants: Secondary | ICD-10-CM | POA: Diagnosis not present

## 2018-06-18 DIAGNOSIS — Z8781 Personal history of (healed) traumatic fracture: Secondary | ICD-10-CM | POA: Diagnosis not present

## 2018-06-29 DIAGNOSIS — Z8781 Personal history of (healed) traumatic fracture: Secondary | ICD-10-CM | POA: Diagnosis not present

## 2018-06-29 DIAGNOSIS — Z9889 Other specified postprocedural states: Secondary | ICD-10-CM | POA: Diagnosis not present

## 2018-07-13 ENCOUNTER — Other Ambulatory Visit: Payer: Self-pay

## 2018-07-13 ENCOUNTER — Emergency Department
Admission: EM | Admit: 2018-07-13 | Discharge: 2018-07-13 | Disposition: A | Discharge status: Home / Self Care | Payer: Medicaid Other | Attending: Emergency Medicine | Admitting: Emergency Medicine

## 2018-07-13 DIAGNOSIS — J45909 Unspecified asthma, uncomplicated: Secondary | ICD-10-CM | POA: Insufficient documentation

## 2018-07-13 DIAGNOSIS — Y92017 Garden or yard in single-family (private) house as the place of occurrence of the external cause: Secondary | ICD-10-CM | POA: Diagnosis not present

## 2018-07-13 DIAGNOSIS — Y998 Other external cause status: Secondary | ICD-10-CM | POA: Diagnosis not present

## 2018-07-13 DIAGNOSIS — S0502XA Injury of conjunctiva and corneal abrasion without foreign body, left eye, initial encounter: Secondary | ICD-10-CM | POA: Diagnosis not present

## 2018-07-13 DIAGNOSIS — F17210 Nicotine dependence, cigarettes, uncomplicated: Secondary | ICD-10-CM | POA: Diagnosis not present

## 2018-07-13 DIAGNOSIS — Y93H2 Activity, gardening and landscaping: Secondary | ICD-10-CM | POA: Diagnosis not present

## 2018-07-13 DIAGNOSIS — W228XXA Striking against or struck by other objects, initial encounter: Secondary | ICD-10-CM | POA: Diagnosis not present

## 2018-07-13 DIAGNOSIS — S0592XA Unspecified injury of left eye and orbit, initial encounter: Secondary | ICD-10-CM | POA: Diagnosis present

## 2018-07-13 MED ORDER — TETRACAINE HCL 0.5 % OP SOLN
2.0000 [drp] | Freq: Once | OPHTHALMIC | Status: AC
  Administered 2018-07-13: 2 [drp] via OPHTHALMIC
  Filled 2018-07-13: qty 4

## 2018-07-13 MED ORDER — POLYMYXIN B-TRIMETHOPRIM 10000-0.1 UNIT/ML-% OP SOLN: 2.0000 [drp] | Freq: Four times a day (QID) | OPHTHALMIC | 0 refills | Status: DC

## 2018-07-13 MED ORDER — FLUORESCEIN SODIUM 1 MG OP STRP
1.0000 | ORAL_STRIP | Freq: Once | OPHTHALMIC | Status: AC
  Administered 2018-07-13: 1 via OPHTHALMIC
  Filled 2018-07-13: qty 1

## 2018-07-13 MED ORDER — KETOROLAC TROMETHAMINE 0.5 % OP SOLN: 1.0000 [drp] | Freq: Four times a day (QID) | OPHTHALMIC | 0 refills | Status: DC

## 2018-07-13 NOTE — ED Triage Notes (Signed)
Pt states that while he was weed eating something flew up and hit his left eye. Pt has rinsed it and states he feels like something is in it still, no obvious deformity noted at this time, pt able to move globe all around with ease

## 2018-07-13 NOTE — ED Notes (Signed)

## 2018-07-13 NOTE — ED Provider Notes (Signed)
[facility] Emergency Department Provider Note  ____________________________________________  Time seen: Approximately 4:13 PM  I have reviewed the triage vital signs and the nursing notes.   HISTORY  Chief Complaint Eye Injury    HPI Juan Hooper is a 17 y.o. male who presents the emergency department complaining of left eye foreign body sensation.  Patient was using a weed eater and wearing glasses when something flew behind the glasses and hit his left eye.  Patient does not wear corrective lenses including contacts.  Patient rinsed out his eye with water prior to arrival but continues to experience foreign body sensation.  No visual changes or vision loss.  No other injury or complaint at this time.    Past Medical History:  Diagnosis Date  . Asthma     There are no active problems to display for this patient.   Past Surgical History:  Procedure Laterality Date  . NO PAST SURGERIES    . OPEN REDUCTION INTERNAL FIXATION (ORIF) METACARPAL Left 06/14/2018   Procedure: OPEN REDUCTION INTERNAL FIXATION (ORIF) METACARPAL;  Surgeon: Kennedy Bucker, MD;  Location: [facility] ORS;  Service: Orthopedics;  Laterality: Left;    Prior to Admission medications   Medication Sig Start Date End Date Taking? Authorizing Provider  acetaminophen (TYLENOL) 325 MG tablet Take 650 mg by mouth every 6 (six) hours as needed.    [provider]  HYDROcodone-acetaminophen (NORCO) 5-325 MG tablet Take 1 tablet by mouth every 6 (six) hours as needed for moderate pain. 06/14/18   Kennedy Bucker, MD  ibuprofen (ADVIL,MOTRIN) 600 MG tablet Take 1 tablet (600 mg total) by mouth every 6 (six) hours as needed for fever, headache or moderate pain. 05/30/18   Sudie Grumbling, NP  ketorolac (ACULAR) 0.5 % ophthalmic solution Place 1 drop into the left eye 4 (four) times daily. 07/13/18   Savera Donson, Delorise Royals, PA-C  traMADol (ULTRAM) 50 MG tablet Take 1 tablet (50 mg total) by mouth every  6 (six) hours as needed. 06/07/18   Fisher, Roselyn Bering, PA-C  trimethoprim-polymyxin b (POLYTRIM) ophthalmic solution Place 2 drops into the left eye every 6 (six) hours. 07/13/18   Raiford Fetterman, Delorise Royals, PA-C    Allergies Patient has no known allergies.  Family History  Problem Relation Age of Onset  . Diabetes Mother   . Stroke Father     Social History Social History   Tobacco Use  . Smoking status: Current Every Day Smoker    Packs/day: 0.50    Types: Cigarettes  . Smokeless tobacco: Former Engineer, water Use Topics  . Alcohol use: No  . Drug use: No     Review of Systems  Constitutional: No fever/chills Eyes: No visual changes. No discharge.  Positive for left eye injury/foreign body sensation ENT: No upper respiratory complaints. Cardiovascular: no chest pain. Respiratory: no cough. No SOB. Gastrointestinal: No abdominal pain.  No nausea, no vomiting.   Musculoskeletal: Negative for musculoskeletal pain. Skin: Negative for rash, abrasions, lacerations, ecchymosis. Neurological: Negative for headaches, focal weakness or numbness. 10-point ROS otherwise negative.  ____________________________________________   PHYSICAL EXAM:  VITAL SIGNS: ED Triage Vitals  Enc Vitals Group     BP 07/13/18 1546 (!) 147/70     Pulse Rate 07/13/18 1546 75     Resp 07/13/18 1546 18     Temp 07/13/18 1546 98.3 F (36.8 C)     Temp Source 07/13/18 1546 Oral     SpO2 07/13/18 1546 98 %  Weight 07/13/18 1547 170 lb (77.1 kg)     Height 07/13/18 1547 6' (1.829 m)     Head Circumference --      Peak Flow --      Pain Score 07/13/18 1547 10     Pain Loc --      Pain Edu? --      Excl. in [place]? --      Constitutional: Alert and oriented. Well appearing and in no acute distress. Eyes: Conjunctivae are normal. PERRL. EOMI. visualization of external eye reveals no foreign body, gross abnormality.  No subconjunctival hemorrhaging.  Funduscopic exam reveals good red reflex bilaterally,  vasculature and optic disc is appreciated with no abnormality.  Eyes anesthetized using tetracaine drops, fluorescein staining is applied.  2 areas of uptake in the 7 o'clock position are appreciated.  These are linear, very superficial.  No visible foreign body. Head: Atraumatic. ENT:      Ears:       Nose: No congestion/rhinnorhea.      Mouth/Throat: Mucous membranes are moist.  Neck: No stridor.    Cardiovascular: Normal rate, regular rhythm. Normal S1 and S2.  Good peripheral circulation. Respiratory: Normal respiratory effort without tachypnea or retractions. Lungs CTAB. Good air entry to the bases with no decreased or absent breath sounds. Musculoskeletal: Full range of motion to all extremities. No gross deformities appreciated. Neurologic:  Normal speech and language. No gross focal neurologic deficits are appreciated.  Skin:  Skin is warm, dry and intact. No rash noted. Psychiatric: Mood and affect are normal. Speech and behavior are normal. Patient exhibits appropriate insight and judgement.   ____________________________________________   LABS (all labs ordered are listed, but only abnormal results are displayed)  Labs Reviewed - No data to display ____________________________________________  EKG   ____________________________________________  RADIOLOGY   No results found.  ____________________________________________    PROCEDURES  Procedure(s) performed:    Procedures    Medications  tetracaine (PONTOCAINE) 0.5 % ophthalmic solution 2 drop (2 drops Left Eye Given by Other 07/13/18 1555)  fluorescein ophthalmic strip 1 strip (1 strip Left Eye Given by Other 07/13/18 1555)     ____________________________________________   INITIAL IMPRESSION / ASSESSMENT AND PLAN / ED COURSE  Pertinent labs & imaging results that were available during my care of the patient were reviewed by me and considered in my medical decision making (see chart for  details).  Review of the [place] CSRS was performed in accordance of the [facility] prior to dispensing any controlled drugs.      Patient's diagnosis is consistent with corneal abrasion.  Patient presented to the emergency department after something flew into his eye while reading.  Exam reveals 2 areas of uptake with floor seen staining consistent with superficial abrasions.  No visible foreign body.  Globe intact.  Patient will be placed on Polytrim and Acular for symptom improvement. he will follow-up with ophthalmology as needed.Patient is given ED precautions to return to the ED for any worsening or new symptoms.     ____________________________________________  FINAL CLINICAL IMPRESSION(S) / ED DIAGNOSES  Final diagnoses:  Abrasion of left cornea, initial encounter      NEW MEDICATIONS STARTED DURING THIS VISIT:  ED Discharge Orders         Ordered    trimethoprim-polymyxin b (POLYTRIM) ophthalmic solution  Every 6 hours     07/13/18 1619    ketorolac (ACULAR) 0.5 % ophthalmic solution  4 times daily     07/13/18 1619  This chart was dictated using voice recognition software/Dragon. Despite best efforts to proofread, errors can occur which can change the meaning. Any change was purely unintentional.    Racheal PatchesCuthriell, Mabelle Mungin D, PA-C 07/13/18 1619    Emily FilbertWilliams, Jameer Storie E, MD 07/13/18 [phone]

## 2018-12-25 DIAGNOSIS — R05 Cough: Secondary | ICD-10-CM | POA: Diagnosis not present

## 2018-12-25 DIAGNOSIS — J069 Acute upper respiratory infection, unspecified: Secondary | ICD-10-CM | POA: Diagnosis not present

## 2018-12-25 DIAGNOSIS — R51 Headache: Secondary | ICD-10-CM | POA: Diagnosis not present

## 2018-12-27 DIAGNOSIS — R1110 Vomiting, unspecified: Secondary | ICD-10-CM | POA: Diagnosis not present

## 2018-12-27 DIAGNOSIS — R05 Cough: Secondary | ICD-10-CM | POA: Diagnosis not present

## 2018-12-27 DIAGNOSIS — R51 Headache: Secondary | ICD-10-CM | POA: Diagnosis not present

## 2018-12-27 DIAGNOSIS — J069 Acute upper respiratory infection, unspecified: Secondary | ICD-10-CM | POA: Diagnosis not present

## 2019-02-26 DIAGNOSIS — F5104 Psychophysiologic insomnia: Secondary | ICD-10-CM | POA: Diagnosis not present

## 2019-02-26 DIAGNOSIS — Z0000 Encounter for general adult medical examination without abnormal findings: Secondary | ICD-10-CM | POA: Diagnosis not present

## 2019-02-26 DIAGNOSIS — J45909 Unspecified asthma, uncomplicated: Secondary | ICD-10-CM | POA: Diagnosis not present

## 2019-04-09 ENCOUNTER — Other Ambulatory Visit: Payer: Self-pay | Admitting: Family Medicine

## 2019-04-09 ENCOUNTER — Ambulatory Visit
Admission: RE | Admit: 2019-04-09 | Discharge: 2019-04-09 | Disposition: A | Discharge status: Home / Self Care | Payer: Medicaid Other | Attending: Family Medicine | Admitting: Family Medicine

## 2019-04-09 ENCOUNTER — Ambulatory Visit
Admission: RE | Admit: 2019-04-09 | Discharge: 2019-04-09 | Disposition: A | Discharge status: Home / Self Care | Payer: Medicaid Other | Source: Ambulatory Visit | Attending: Family Medicine | Admitting: Family Medicine

## 2019-04-09 ENCOUNTER — Other Ambulatory Visit: Payer: Self-pay

## 2019-04-09 DIAGNOSIS — M25511 Pain in right shoulder: Secondary | ICD-10-CM

## 2019-04-09 DIAGNOSIS — F5104 Psychophysiologic insomnia: Secondary | ICD-10-CM | POA: Diagnosis not present
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
## 2019-04-18 IMAGING — DX DG HAND 2V*L*
2 series · 2 of 2 positions shown · non-contrast
Comparison: 06/07/2018

CLINICAL DATA: Postop.

EXAM:
LEFT HAND - 2 VIEW

[hand lat]
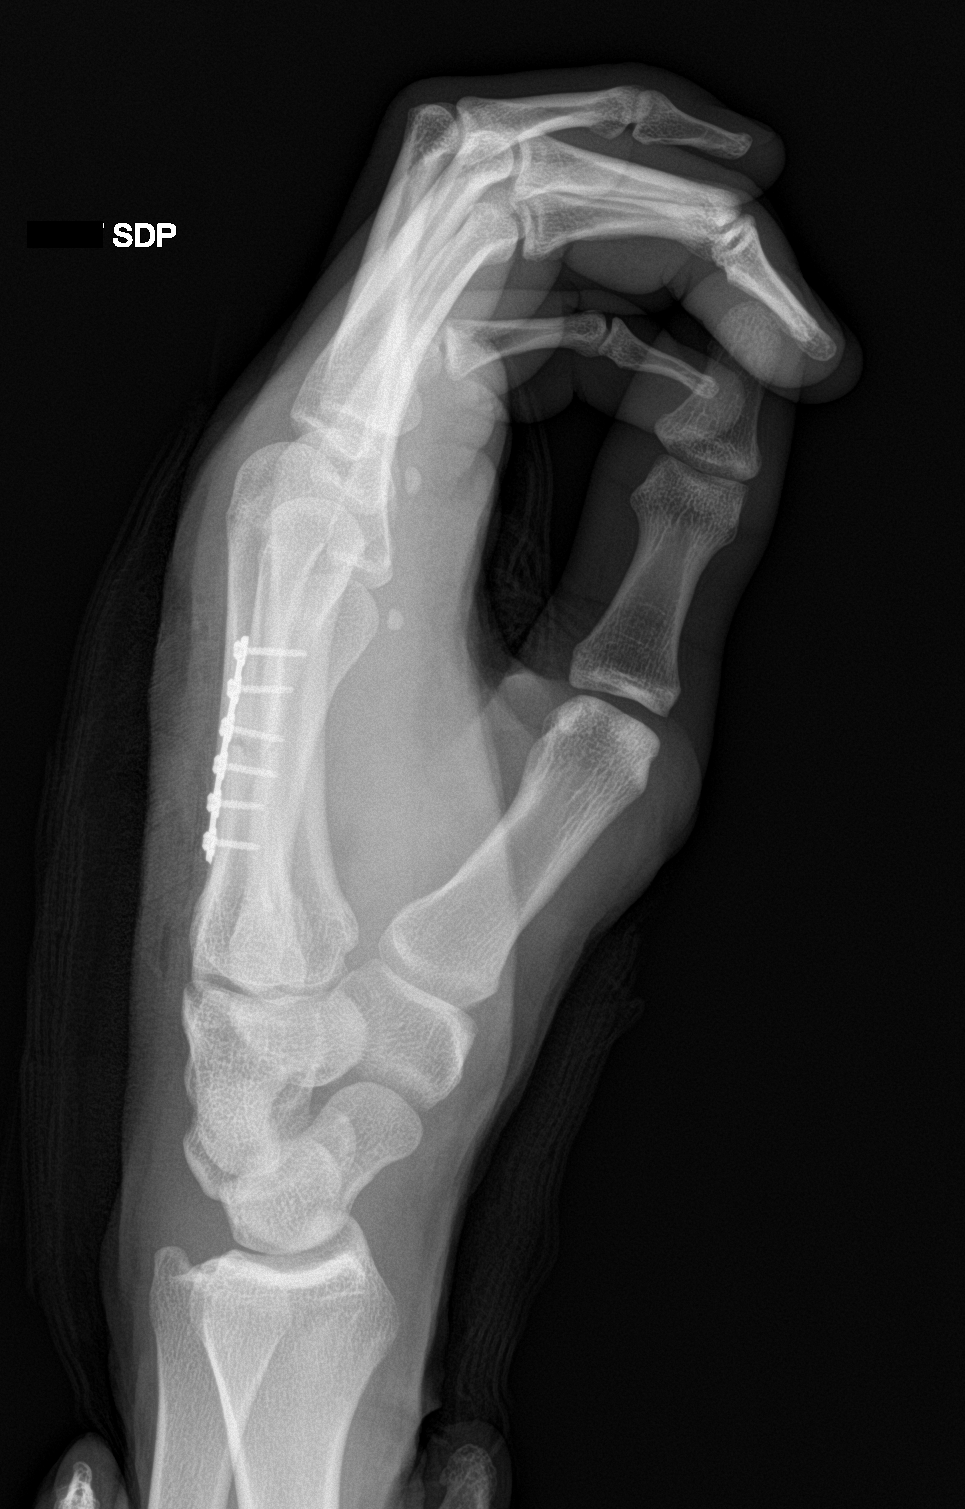

[hand ap]
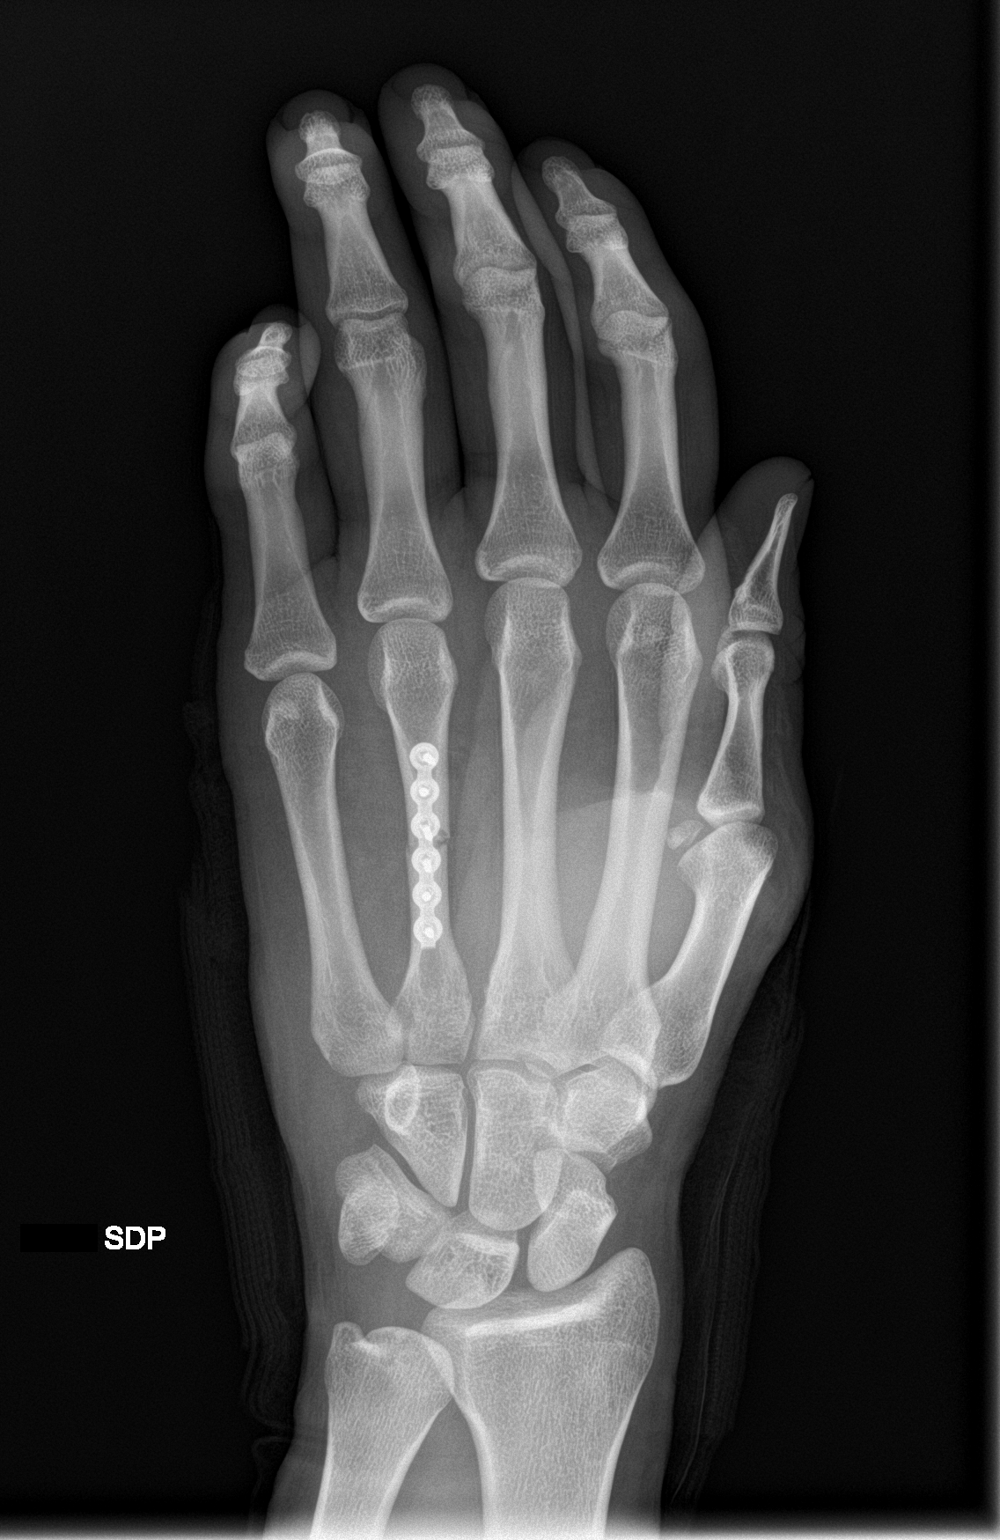

[2 of 2 positions shown; findings below may reference images not displayed]

FINDINGS: Two-view exam shows the patient be status post ORIF for transverse
fracture of the mid metacarpal of the ring finger. Bony alignment is
markedly improved compared to the pre reduction film. No evidence
for immediate hardware complication.
IMPRESSION: Status post ORIF for ring finger metacarpal fracture. No evidence
for immediate hardware complications.

## 2019-12-06 ENCOUNTER — Other Ambulatory Visit: Payer: Self-pay

## 2019-12-06 ENCOUNTER — Ambulatory Visit: Payer: Medicaid Other

## 2019-12-06 ENCOUNTER — Ambulatory Visit
Admission: EM | Admit: 2019-12-06 | Discharge: 2019-12-06 | Disposition: A | Discharge status: Home / Self Care | Payer: Medicaid Other | Attending: Family Medicine | Admitting: Family Medicine

## 2019-12-06 DIAGNOSIS — R0781 Pleurodynia: Secondary | ICD-10-CM | POA: Insufficient documentation

## 2019-12-06 DIAGNOSIS — F17210 Nicotine dependence, cigarettes, uncomplicated: Secondary | ICD-10-CM

## 2019-12-06 MED ORDER — KETOROLAC TROMETHAMINE 10 MG PO TABS: 10.0000 mg | ORAL_TABLET | Freq: Four times a day (QID) | ORAL | 0 refills | Status: DC | PRN
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
## 2019-12-06 NOTE — Discharge Instructions (Signed)
No fracture.  Pain medication as needed. Do not take with ibuprofen, naproxen or other NSAID's.  Heat.  Take care  Dr. Adriana Simas

## 2019-12-11 DIAGNOSIS — R0602 Shortness of breath: Secondary | ICD-10-CM | POA: Diagnosis not present

## 2019-12-11 DIAGNOSIS — R0781 Pleurodynia: Secondary | ICD-10-CM | POA: Diagnosis not present

## 2019-12-11 DIAGNOSIS — R0600 Dyspnea, unspecified: Secondary | ICD-10-CM | POA: Diagnosis not present

## 2019-12-11 DIAGNOSIS — S20211A Contusion of right front wall of thorax, initial encounter: Secondary | ICD-10-CM | POA: Diagnosis not present

## 2020-03-23 ENCOUNTER — Other Ambulatory Visit: Payer: Self-pay

## 2020-03-23 ENCOUNTER — Ambulatory Visit: Payer: Medicaid Other

## 2020-03-23 ENCOUNTER — Ambulatory Visit
Admission: EM | Admit: 2020-03-23 | Discharge: 2020-03-23 | Disposition: A | Discharge status: Home / Self Care | Payer: Medicaid Other | Attending: Emergency Medicine | Admitting: Emergency Medicine

## 2020-03-23 DIAGNOSIS — M7989 Other specified soft tissue disorders: Secondary | ICD-10-CM | POA: Diagnosis not present

## 2020-03-23 DIAGNOSIS — S93492A Sprain of other ligament of left ankle, initial encounter: Secondary | ICD-10-CM

## 2020-03-23 DIAGNOSIS — S9032XA Contusion of left foot, initial encounter: Secondary | ICD-10-CM | POA: Diagnosis not present

## 2020-03-23 DIAGNOSIS — S99912A Unspecified injury of left ankle, initial encounter: Secondary | ICD-10-CM | POA: Diagnosis not present

## 2020-03-23 DIAGNOSIS — Y9375 Activity, martial arts: Secondary | ICD-10-CM | POA: Diagnosis not present

## 2020-03-23 NOTE — Discharge Instructions (Addendum)
Continue ice, elevation above your heart.  Wear the ASO at all times for the first few weeks, then as needed for comfort.  Take 600 mg of ibuprofen combined with 1000 mg of Tylenol 3 or 4 times a day as needed for pain.  Follow-up with [facility] in several weeks if you are still having pain.  You may require physical therapy.
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
## 2020-05-04 ENCOUNTER — Encounter: Payer: Self-pay | Admitting: Emergency Medicine

## 2020-05-04 ENCOUNTER — Other Ambulatory Visit: Payer: Self-pay

## 2020-05-04 ENCOUNTER — Emergency Department
Admission: EM | Admit: 2020-05-04 | Discharge: 2020-05-04 | Disposition: A | Discharge status: Home / Self Care | Payer: Medicaid Other | Attending: Emergency Medicine | Admitting: Emergency Medicine

## 2020-05-04 DIAGNOSIS — F17210 Nicotine dependence, cigarettes, uncomplicated: Secondary | ICD-10-CM | POA: Insufficient documentation

## 2020-05-04 DIAGNOSIS — L03116 Cellulitis of left lower limb: Secondary | ICD-10-CM | POA: Diagnosis not present

## 2020-05-04 DIAGNOSIS — J45909 Unspecified asthma, uncomplicated: Secondary | ICD-10-CM | POA: Insufficient documentation

## 2020-05-04 DIAGNOSIS — R1904 Left lower quadrant abdominal swelling, mass and lump: Secondary | ICD-10-CM | POA: Diagnosis present

## 2020-05-04 MED ORDER — SULFAMETHOXAZOLE-TRIMETHOPRIM 800-160 MG PO TABS: 1.0000 | ORAL_TABLET | Freq: Two times a day (BID) | ORAL | 0 refills | Status: DC

## 2020-05-04 MED ORDER — LIDOCAINE HCL (PF) 1 % IJ SOLN
5.0000 mL | Freq: Once | INTRAMUSCULAR | Status: DC
  Filled 2020-05-04: qty 5

## 2020-05-04 MED ORDER — TRAMADOL HCL 50 MG PO TABS: 50.0000 mg | ORAL_TABLET | Freq: Two times a day (BID) | ORAL | 0 refills | Status: AC

## 2020-05-04 MED ORDER — SULFAMETHOXAZOLE-TRIMETHOPRIM 800-160 MG PO TABS
1.0000 | ORAL_TABLET | Freq: Once | ORAL | Status: AC
  Administered 2020-05-04: 1 via ORAL
  Filled 2020-05-04: qty 1

## 2020-05-04 NOTE — ED Triage Notes (Signed)
Pt presents to ED via POV with c/o abscess to L groin x 3 days. [place] and it has progressively worsened at this time.

## 2020-05-04 NOTE — Discharge Instructions (Signed)
You are being treated for a skin cellulitis, there is no evidence of a drainable abscess at this time. Take the antibiotics as directed, and apply warm compresses to promote healing. Follow-up with your provider or return to the ED as needed for abscess management.

## 2020-05-04 NOTE — ED Provider Notes (Signed)
[facility] Emergency Department Provider Note ____________________________________________  Time seen: 1558  I have reviewed the triage vital signs and the nursing notes.  HISTORY  Chief Complaint  Abscess  HPI Juan Hooper is a 19 y.o. male presents himself to the ED for evaluation of a tender swollen area.   He reports an firm red area to the left groin that has been present for the last 3 days.  He describes increasing pain and swelling over the last 3 to 5 days.  He denies any other fevers, chills, or sweats.  He also denies any spontaneous purulent drainage or dysuria.  Past Medical History:  Diagnosis Date  . Asthma     There are no problems to display for this patient.   Past Surgical History:  Procedure Laterality Date  . NO PAST SURGERIES    . OPEN REDUCTION INTERNAL FIXATION (ORIF) METACARPAL Left 06/14/2018   Procedure: OPEN REDUCTION INTERNAL FIXATION (ORIF) METACARPAL;  Surgeon: Kennedy Bucker, MD;  Location: [facility] ORS;  Service: Orthopedics;  Laterality: Left;    Prior to Admission medications   Medication Sig Start Date End Date Taking? Authorizing Provider  sulfamethoxazole-trimethoprim (BACTRIM DS) 800-160 MG tablet Take 1 tablet by mouth 2 (two) times daily. 05/04/20   Leshawn Straka, Charlesetta Ivory, PA-C  traMADol (ULTRAM) 50 MG tablet Take 1 tablet (50 mg total) by mouth 2 (two) times daily for 5 days. 05/04/20 05/09/20  Modupe Shampine, Charlesetta Ivory, PA-C    Allergies Patient has no known allergies.  Family History  Problem Relation Age of Onset  . Diabetes Mother   . Stroke Father     Social History Social History   Tobacco Use  . Smoking status: Current Every Day Smoker    Packs/day: 0.50    Types: Cigarettes  . Smokeless tobacco: Former Engineer, water Use Topics  . Alcohol use: No  . Drug use: No    Review of Systems  Constitutional: Negative for fever. Cardiovascular: Negative for chest pain. Respiratory: Negative for  shortness of breath. Gastrointestinal: Negative for abdominal pain, vomiting and diarrhea. Genitourinary: Negative for dysuria. Musculoskeletal: Negative for back pain. Skin: Negative for rash.  Skin tenderness as above. Neurological: Negative for headaches, focal weakness or numbness. ____________________________________________  PHYSICAL EXAM:  VITAL SIGNS: ED Triage Vitals  Enc Vitals Group     BP 05/04/20 1538 137/66     Pulse Rate 05/04/20 1538 70     Resp 05/04/20 1538 18     Temp 05/04/20 1538 98.4 F (36.9 C)     Temp src --      SpO2 05/04/20 1538 97 %     Weight 05/04/20 1541 187 lb (84.8 kg)     Height 05/04/20 1541 6\' 1"  (1.854 m)     Head Circumference --      Peak Flow --      Pain Score 05/04/20 1540 10     Pain Loc --      Pain Edu? --      Excl. in GC? --     Constitutional: Alert and oriented. Well appearing and in no distress. Head: Normocephalic and atraumatic. Eyes: Conjunctivae are normal. Normal extraocular movements Hematological/Lymphatic/Immunological: No cervical lymphadenopathy. Cardiovascular: Normal rate, regular rhythm. Normal distal pulses. Respiratory: Normal respiratory effort. No wheezes/rales/rhonchi. Gastrointestinal: Soft and nontender. No distention. Musculoskeletal: Nontender with normal range of motion in all extremities.  Neurologic:  Normal gait without ataxia. Normal speech and language. No gross focal neurologic deficits are  appreciated. Skin:  Skin is warm, dry and intact. No rash noted.  Left upper thigh with a firm erythematous mild area without signs of pointing, central punctum, or active spontaneous drainage.  The area feels more consistent with a local cellulitis. Psychiatric: Mood and affect are normal. Patient exhibits appropriate insight and judgment. ____________________________________________  PROCEDURES  Bactrim DS 1 PO  Procedures ____________________________________________  INITIAL IMPRESSION / ASSESSMENT  AND PLAN / ED COURSE  Patient with ED evaluation of an area to the left upper thigh concerning for an early abscess.  Patient presents with area is erythematous and firm but no signs of appreciable drainable abscess.  He was offered a therapeutic I&D procedure, but declined at this time.  He will be started empirically on Bactrim, and is advised to follow-up with his primary provider at this ED for ongoing symptoms.  DERRAN SEAR was evaluated in Emergency Department on 05/04/2020 for the symptoms described in the history of present illness. He was evaluated in the context of the global COVID-19 pandemic, which necessitated consideration that the patient might be at risk for infection with the SARS-CoV-2 virus that causes COVID-19. Institutional protocols and algorithms that pertain to the evaluation of patients at risk for COVID-19 are in a state of rapid change based on information released by regulatory bodies including the CDC and federal and state organizations. These policies and algorithms were followed during the patient's care in the ED.  I reviewed the patient's prescription history over the last 12 months in the multi-state controlled substances database(s) that includes [place], [place], [place], [place], [place], [place], [place], [place], [place], [place], [place], [place], [place], and [place].  Results were notable for no current RX.  ____________________________________________  FINAL CLINICAL IMPRESSION(S) / ED DIAGNOSES  Final diagnoses:  Cellulitis of left lower extremity      Severn Goddard, Dannielle Karvonen, PA-C 05/04/20 2345    Nance Pear, MD 05/07/20 1558

## 2020-05-16 ENCOUNTER — Other Ambulatory Visit: Payer: Self-pay

## 2020-05-16 ENCOUNTER — Ambulatory Visit (INDEPENDENT_AMBULATORY_CARE_PROVIDER_SITE_OTHER): Payer: Medicaid Other

## 2020-05-16 ENCOUNTER — Encounter: Payer: Self-pay | Admitting: Emergency Medicine

## 2020-05-16 ENCOUNTER — Ambulatory Visit
Admission: EM | Admit: 2020-05-16 | Discharge: 2020-05-16 | Disposition: A | Discharge status: Home / Self Care | Payer: Medicaid Other | Attending: Emergency Medicine | Admitting: Emergency Medicine

## 2020-05-16 DIAGNOSIS — M79671 Pain in right foot: Secondary | ICD-10-CM

## 2020-05-16 DIAGNOSIS — S8261XA Displaced fracture of lateral malleolus of right fibula, initial encounter for closed fracture: Secondary | ICD-10-CM

## 2020-05-16 DIAGNOSIS — M25571 Pain in right ankle and joints of right foot: Secondary | ICD-10-CM | POA: Diagnosis not present

## 2020-05-16 MED ORDER — NAPROXEN 500 MG PO TABS: 500.0000 mg | ORAL_TABLET | Freq: Two times a day (BID) | ORAL | 0 refills | Status: DC | PRN

## 2020-05-16 MED ORDER — KETOROLAC TROMETHAMINE 60 MG/2ML IM SOLN
30.0000 mg | Freq: Once | INTRAMUSCULAR | Status: AC
  Administered 2020-05-16: 30 mg via INTRAMUSCULAR

## 2020-05-16 NOTE — ED Provider Notes (Signed)
[facility]    CSN: [id number] Arrival date & time: 05/16/20  1114      History   Chief Complaint Chief Complaint  Patient presents with  . Ankle Pain    right    HPI Juan Hooper is a 19 y.o. male.   19 year old male presents with right ankle injury that occurred yesterday.  He was playing basketball when he jumped up and landed on another player's foot and rolled his right ankle outwards.  He felt immediate pain and a "snap" in his ankle.  He experienced swelling within 15 minutes and has been unable to put any weight on his foot or ankle.  He has been trying to apply ice and heat and elevate his foot but continues to have increased pain and swelling.  He has not taken any medication for pain.  He sprained his left ankle about 2 months ago and was seen here at Urgent Care.  Otherwise no other chronic health issues.  Takes no daily medication.  The history is provided by the patient.    Past Medical History:  Diagnosis Date  . Asthma     There are no problems to display for this patient.   Past Surgical History:  Procedure Laterality Date  . NO PAST SURGERIES    . OPEN REDUCTION INTERNAL FIXATION (ORIF) METACARPAL Left 06/14/2018   Procedure: OPEN REDUCTION INTERNAL FIXATION (ORIF) METACARPAL;  Surgeon: Kennedy Bucker, MD;  Location: [facility] ORS;  Service: Orthopedics;  Laterality: Left;       Home Medications    Prior to Admission medications   Medication Sig Start Date End Date Taking? Authorizing Provider  naproxen (NAPROSYN) 500 MG tablet Take 1 tablet (500 mg total) by mouth 2 (two) times daily as needed for moderate pain (and swelling). 05/16/20   Sudie Grumbling, NP    Family History Family History  Problem Relation Age of Onset  . Diabetes Mother   . Stroke Father     Social History Social History   Tobacco Use  . Smoking status: Current Every Day Smoker    Packs/day: 0.50    Types: Cigarettes  . Smokeless tobacco: Former Geophysicist/field seismologist  . Vaping Use: Former  Substance Use Topics  . Alcohol use: No  . Drug use: No     Allergies   Patient has no known allergies.   Review of Systems Review of Systems  Constitutional: Negative for appetite change, chills, fatigue and fever.  Respiratory: Negative for chest tightness, shortness of breath and wheezing.   Musculoskeletal: Positive for arthralgias, gait problem, joint swelling and myalgias.  Skin: Positive for color change. Negative for rash and wound.  Allergic/Immunologic: Negative for environmental allergies, food allergies and immunocompromised state.  Neurological: Negative for tremors, weakness, numbness and headaches.  Hematological: Negative for adenopathy. Does not bruise/bleed easily.     Physical Exam Triage Vital Signs ED Triage Vitals  Enc Vitals Group     BP 05/16/20 1156 (!) 145/79     Pulse Rate 05/16/20 1156 76     Resp 05/16/20 1156 16     Temp 05/16/20 1156 98.4 F (36.9 C)     Temp Source 05/16/20 1156 Oral     SpO2 05/16/20 1156 99 %     Weight 05/16/20 1154 190 lb (86.2 kg)     Height 05/16/20 1154 6\' 1"  (1.854 m)     Head Circumference --      Peak Flow --  Pain Score 05/16/20 1154 7     Pain Loc --      Pain Edu? --      Excl. in GC? --    No data found.  Updated Vital Signs BP (!) 145/79 (BP Location: Left Arm)   Pulse 76   Temp 98.4 F (36.9 C) (Oral)   Resp 16   Ht 6\' 1"  (1.854 m)   Wt 190 lb (86.2 kg)   SpO2 99%   BMI 25.07 kg/m   Visual Acuity Right Eye Distance:   Left Eye Distance:   Bilateral Distance:    Right Eye Near:   Left Eye Near:    Bilateral Near:     Physical Exam Vitals and nursing note reviewed.  Constitutional:      General: He is awake.     Appearance: He is well-developed and well-groomed.     Comments: He is sitting in a wheel chair in no acute distress but appears to be in pain and has difficulty moving his right foot due to the pain.   HENT:     Head: Normocephalic and  atraumatic.  Cardiovascular:     Rate and Rhythm: Normal rate.     Pulses:          Dorsalis pedis pulses are 2+ on the right side and 2+ on the left side.  Pulmonary:     Effort: Pulmonary effort is normal.  Musculoskeletal:        General: Swelling, tenderness and signs of injury present.     Right ankle: Swelling and ecchymosis present. Tenderness present over the lateral malleolus. Decreased range of motion. Normal pulse.     Right Achilles Tendon: Normal.     Left ankle: Normal.     Right foot: Decreased range of motion.     Left foot: Normal range of motion.       Feet:     Comments: Too painful to perform ROM but able to wiggle toes. Right lateral malleolus and at base of foot with ecchymosis and swelling. Very tender to palpation. No pain near heel or achilles tendon. Good sensation and pulses. No neuro deficits noted. Brisk capillary refill.   Feet:     Right foot:     Skin integrity: Skin integrity normal.     Left foot:     Skin integrity: Skin integrity normal.  Skin:    General: Skin is warm and dry.     Capillary Refill: Capillary refill takes less than 2 seconds.     Findings: Bruising and ecchymosis present. No rash or wound.  Neurological:     General: No focal deficit present.     Mental Status: He is alert and oriented to person, place, and time.     Sensory: Sensation is intact.     Motor: Motor function is intact.     Comments: Unable to bear weight on right foot/ankle.   Psychiatric:        Mood and Affect: Mood is anxious.        Speech: Speech normal.        Behavior: Behavior normal. Behavior is cooperative.        Thought Content: Thought content normal.        Judgment: Judgment normal.     Comments: Continues to ask about crutches and ankle support so he can go to work on Monday.       UC Treatments / Results  Labs (all labs ordered are listed,  but only abnormal results are displayed) Labs Reviewed - No data to  display  EKG   Radiology DG Ankle Complete Right  Result Date: 05/16/2020 CLINICAL DATA:  Lateral ankle and foot pain after twisting injury playing basketball yesterday. EXAM: RIGHT ANKLE - COMPLETE 3+ VIEW; RIGHT FOOT COMPLETE - 3+ VIEW COMPARISON:  None. FINDINGS: Tiny avulsion fracture of the posterior malleolus. No additional fracture. No dislocation. The ankle mortise is symmetric. The talar dome is intact. Joint spaces are preserved. Bone mineralization is normal. Diffuse soft tissue swelling, most prominent laterally. IMPRESSION: 1. Tiny avulsion fracture of the posterior malleolus. Diffuse soft tissue swelling. 2. No acute osseous abnormality of the right foot. Electronically Signed   By: Titus Dubin M.D.   On: 05/16/2020 12:51   DG Foot Complete Right  Result Date: 05/16/2020 CLINICAL DATA:  Lateral ankle and foot pain after twisting injury playing basketball yesterday. EXAM: RIGHT ANKLE - COMPLETE 3+ VIEW; RIGHT FOOT COMPLETE - 3+ VIEW COMPARISON:  None. FINDINGS: Tiny avulsion fracture of the posterior malleolus. No additional fracture. No dislocation. The ankle mortise is symmetric. The talar dome is intact. Joint spaces are preserved. Bone mineralization is normal. Diffuse soft tissue swelling, most prominent laterally. IMPRESSION: 1. Tiny avulsion fracture of the posterior malleolus. Diffuse soft tissue swelling. 2. No acute osseous abnormality of the right foot. Electronically Signed   By: Titus Dubin M.D.   On: 05/16/2020 12:51    Procedures Procedures (including critical care time)  Medications Ordered in UC Medications  ketorolac (TORADOL) injection 30 mg (30 mg Intramuscular Given 05/16/20 1301)    Initial Impression / Assessment and Plan / UC Course  I have reviewed the triage vital signs and the nursing notes.  Pertinent labs & imaging results that were available during my care of the patient were reviewed by me and considered in my medical decision making (see  chart for details).    Reviewed x-ray results with patient- tiny avulsion fracture of posterior right lateral malleolus- no emergent need for Orthopedics today. Discussed that he also has a bad ankle sprain- unable to determine ligament and tendon damage with x-rays.  Gave Toradol 30mg  IM now to help with pain and swelling. May start Naproxen 500 mg twice a day as needed for pain.  Keep foot elevated as much as possible.  Wear lace up ankle brace-may take off at night if needed for comfort.  Use crutches for support.  Recommend call Dr. Harlow Mares from [facility] or Dr. Vickki Muff, Podiatrist on Monday (2 days) to schedule appointment for follow-up. Final Clinical Impressions(s) / UC Diagnoses   Final diagnoses:  Acute right ankle pain  Acute foot pain, right  Closed avulsion fracture of lateral malleolus of right fibula, initial encounter     Discharge Instructions     You were given a shot of Toradol today to help with pain and swelling of your right ankle. Recommend start Naproxen 500mg  twice a day as needed for pain and swelling. Keep foot elevated as much as possible. Wear right ankle brace- may take off at night if needed for comfort. Use crutches for support. Call Dr. Harlow Mares, Orthopedic or Dr. Vickki Muff, Foot Doctor, on Monday to schedule appointment for follow-up.     ED Prescriptions    Medication Sig Dispense Auth. Provider   naproxen (NAPROSYN) 500 MG tablet Take 1 tablet (500 mg total) by mouth 2 (two) times daily as needed for moderate pain (and swelling). 20 tablet Katy Apo, NP  PDMP not reviewed this encounter.   Sudie Grumbling, NP 05/17/20 1730

## 2020-05-16 NOTE — Discharge Instructions (Addendum)
You were given a shot of Toradol today to help with pain and swelling of your right ankle. Recommend start Naproxen 500mg  twice a day as needed for pain and swelling. Keep foot elevated as much as possible. Wear right ankle brace- may take off at night if needed for comfort. Use crutches for support. Call Dr. , Orthopedic or Dr. Odis Luster, Foot Doctor, on Monday to schedule appointment for follow-up.

## 2020-05-16 NOTE — ED Triage Notes (Signed)
Patient states that he was playing basketball yesterday and when he jumped up he came down on another player's foot and twisted his right ankle. Patient states that he felt a snap in his right ankle.

## 2020-05-18 DIAGNOSIS — S93491A Sprain of other ligament of right ankle, initial encounter: Secondary | ICD-10-CM | POA: Diagnosis not present

## 2020-09-01 DIAGNOSIS — Z720 Tobacco use: Secondary | ICD-10-CM | POA: Diagnosis not present

## 2020-09-01 DIAGNOSIS — F5104 Psychophysiologic insomnia: Secondary | ICD-10-CM | POA: Diagnosis not present

## 2020-09-01 DIAGNOSIS — J4520 Mild intermittent asthma, uncomplicated: Secondary | ICD-10-CM | POA: Diagnosis not present

## 2020-09-01 DIAGNOSIS — Z23 Encounter for immunization: Secondary | ICD-10-CM | POA: Diagnosis not present

## 2020-12-03 ENCOUNTER — Other Ambulatory Visit: Payer: Self-pay

## 2020-12-03 ENCOUNTER — Ambulatory Visit: Payer: Medicaid Other | Admitting: Physician Assistant

## 2020-12-03 ENCOUNTER — Encounter: Payer: Self-pay | Admitting: Physician Assistant

## 2020-12-03 DIAGNOSIS — F909 Attention-deficit hyperactivity disorder, unspecified type: Secondary | ICD-10-CM | POA: Insufficient documentation

## 2020-12-03 DIAGNOSIS — Z113 Encounter for screening for infections with a predominantly sexual mode of transmission: Secondary | ICD-10-CM | POA: Diagnosis not present

## 2020-12-03 DIAGNOSIS — F319 Bipolar disorder, unspecified: Secondary | ICD-10-CM | POA: Insufficient documentation

## 2020-12-03 NOTE — Progress Notes (Signed)
   [facility] Department STI clinic/screening visit  Subjective:  Juan Hooper is a 19 y.o. male being seen today for an STI screening visit. The patient reports they do not have symptoms.    Patient has the following medical conditions:   Patient Active Problem List   Diagnosis Date Noted  . ADHD (attention deficit hyperactivity disorder) 12/03/2020  . Bipolar disorder (HCC) 12/03/2020  . Asthma, mild intermittent 12/28/2011     Chief Complaint  Patient presents with  . SEXUALLY TRANSMITTED DISEASE    screening    HPI  Patient reports that he would like a screening today.  Denies regular medicines and surgeries.  Reports history of asthma, ADHD and depression.  States that his last HIV test was 2 years ago.   See flowsheet for further details and programmatic requirements.    The following portions of the patient's history were reviewed and updated as appropriate: allergies, current medications, past medical history, past social history, past surgical history and problem list.  Objective:  There were no vitals filed for this visit.  Physical Exam Constitutional:      General: He is not in acute distress.    Appearance: Normal appearance.  HENT:     Head: Normocephalic and atraumatic.  Eyes:     Conjunctiva/sclera: Conjunctivae normal.  Pulmonary:     Effort: Pulmonary effort is normal.  Skin:    General: Skin is warm and dry.  Neurological:     Mental Status: He is alert and oriented to person, place, and time.  Psychiatric:        Mood and Affect: Mood normal.        Behavior: Behavior normal.        Thought Content: Thought content normal.        Judgment: Judgment normal.       Assessment and Plan:  Juan Hooper is a 19 y.o. male presenting to the [facility] Department for STI screening  1. Screening for STD (sexually transmitted disease) Patient into clinic without symptoms. Patient declines screening exam for GC and  NGU.  Requests blood work only. Rec condoms with all sex. Await test results.  Counseled that RN will call if needs to RTC for treatment once results are back. - HIV/HCV [facility] - Syphilis Serology, [facility]     No follow-ups on file.  No future appointments.  Matt Holmes, PA

## 2020-12-10 ENCOUNTER — Other Ambulatory Visit: Payer: Self-pay

## 2020-12-10 ENCOUNTER — Ambulatory Visit: Payer: Medicaid Other | Admitting: Physician Assistant

## 2020-12-10 DIAGNOSIS — Z113 Encounter for screening for infections with a predominantly sexual mode of transmission: Secondary | ICD-10-CM

## 2020-12-10 DIAGNOSIS — Z202 Contact with and (suspected) exposure to infections with a predominantly sexual mode of transmission: Secondary | ICD-10-CM

## 2020-12-10 LAB — HM HIV SCREENING LAB: HM HIV Screening: NEGATIVE

## 2020-12-10 LAB — HM HEPATITIS C SCREENING LAB: HM Hepatitis Screen: NEGATIVE

## 2020-12-10 MED ORDER — PENICILLIN G BENZATHINE 1200000 UNIT/2ML IM SUSP
2.4000 10*6.[IU] | Freq: Once | INTRAMUSCULAR | Status: AC
  Administered 2020-12-10: 2.4 10*6.[IU] via INTRAMUSCULAR

## 2020-12-10 NOTE — Progress Notes (Unsigned)
Post:  RN admin Bicillin per C. [place] PA. Patient tolerated well.   Harvie Heck, RN

## 2020-12-11 ENCOUNTER — Encounter: Payer: Self-pay | Admitting: Physician Assistant

## 2020-12-11 NOTE — Progress Notes (Signed)
S: patient into clinic to be treated as a contact to Syphilis today.  States NKDA and that he has not noticed any symptoms since his last visit. O:  WDWN male in NAD, A&O X 3, normal work of breathing.  HIV and Hep C testing are both negative per [facility] web site.  RPR also negative. A/P:  1. Needs treatment as a contact to Syphilis today. 2.  Counseled patient that even though his test was negative, there is an incubation period and he could still have Syphilis but the test is not showing it yet. 3.  Counseled patient on normal SE after injections and that he can use warm compresses and OTC analgesics as needed for soreness and if he has chills or fever as well as drinking plenty of fluids. 4.  No sex for 14 days and until after partner completes treatment. 5.  Give Bicillin 2.4 mu IM today. 6.  Enc condoms with all sex for STD protection. 7.  RTC prn.

## 2020-12-13 ENCOUNTER — Encounter: Payer: Self-pay | Admitting: Student

## 2021-01-04 ENCOUNTER — Other Ambulatory Visit: Payer: Self-pay

## 2021-01-04 ENCOUNTER — Encounter: Payer: Self-pay | Admitting: Emergency Medicine

## 2021-01-04 ENCOUNTER — Ambulatory Visit
Admission: EM | Admit: 2021-01-04 | Discharge: 2021-01-04 | Disposition: A | Discharge status: Home / Self Care | Payer: Medicaid Other | Attending: Family | Admitting: Family

## 2021-01-04 DIAGNOSIS — M79605 Pain in left leg: Secondary | ICD-10-CM | POA: Diagnosis not present

## 2021-01-04 DIAGNOSIS — T148XXA Other injury of unspecified body region, initial encounter: Secondary | ICD-10-CM

## 2021-01-04 MED ORDER — DICLOFENAC SODIUM 75 MG PO TBEC: 75.0000 mg | DELAYED_RELEASE_TABLET | Freq: Two times a day (BID) | ORAL | 0 refills | Status: DC | PRN

## 2021-01-04 MED ORDER — CYCLOBENZAPRINE HCL 10 MG PO TABS: ORAL_TABLET | ORAL | 0 refills | Status: DC

## 2021-01-04 NOTE — Discharge Instructions (Addendum)
Recommend start Diclofenac 75mg  every 12 hours as needed for pain and swelling. Start Flexeril muscle relaxer 1/2 to 1 whole tablet every 8 hours as needed for muscle spasms/pain- may cause drowsiness. Wear ace wrap for support. Use crutches to help with support. Recommend contact or go to [facility] tomorrow if pain does not improve or go to the ER ASAP if pain worsens.

## 2021-01-04 NOTE — ED Provider Notes (Signed)
[facility]    CSN: [id number] Arrival date & time: 01/04/21  0935      History   Chief Complaint Chief Complaint  Patient presents with  . Leg Pain    HPI Juan Hooper is a 20 y.o. male.   20 year old male presents with left lower leg pain from mid knee to ankle that started 4 days ago. He was walking up the stairs to his girlfriend's place when he tripped and fell forward toward the top of the stairs. He doesn't recall hitting his left leg specifically but experienced immediate pain in his left leg. The pain continues to throb and occasionally burn and will increase with certain movements. He noticed the back of his knee and calf muscle were more painful and appeared slightly swollen yesterday but swelling has improved some with elevating leg. He has tried applying ice/heat and taken Tylenol with no relief. He is concerned over the continuation of pain, especially since he works in [place]. He hurt his left knee previously in an accident and concerned that his left leg and knee is already weak and will make the condition worse.  Does smoke tobacco daily. No other chronic health issues. Takes no daily medicaiton.   The history is provided by the patient.    Past Medical History:  Diagnosis Date  . Asthma     Patient Active Problem List   Diagnosis Date Noted  . ADHD (attention deficit hyperactivity disorder) 12/03/2020  . Bipolar disorder (HCC) 12/03/2020  . Asthma, mild intermittent 12/28/2011    Past Surgical History:  Procedure Laterality Date  . NO PAST SURGERIES    . OPEN REDUCTION INTERNAL FIXATION (ORIF) METACARPAL Left 06/14/2018   Procedure: OPEN REDUCTION INTERNAL FIXATION (ORIF) METACARPAL;  Surgeon: Kennedy Bucker, MD;  Location: [facility] ORS;  Service: Orthopedics;  Laterality: Left;       Home Medications    Prior to Admission medications   Medication Sig Start Date End Date Taking? Authorizing Provider  cyclobenzaprine (FLEXERIL) 10 MG  tablet Take 1/2 to 1 whole tablet by mouth every 8 hours as needed for muscle spasms/pain. 01/04/21  Yes Iza Preston, Ali Lowe, NP  diclofenac (VOLTAREN) 75 MG EC tablet Take 1 tablet (75 mg total) by mouth every 12 (twelve) hours as needed for moderate pain. 01/04/21  Yes Sudie Grumbling, NP  PROAIR HFA 108 [phone] Base) MCG/ACT inhaler SMARTSIG:2 Inhalation Via Inhaler Every 6 Hours PRN 11/16/20 01/04/21  [provider]    Family History Family History  Problem Relation Age of Onset  . Diabetes Mother   . Stroke Father     Social History Social History   Tobacco Use  . Smoking status: Current Some Day Smoker    Packs/day: 0.50    Types: Cigars  . Smokeless tobacco: Former Clinical biochemist  . Vaping Use: Former  Substance Use Topics  . Alcohol use: Not Currently  . Drug use: Yes    Types: Marijuana     Allergies   Patient has no known allergies.   Review of Systems Review of Systems  Constitutional: Positive for activity change. Negative for appetite change, chills, fatigue and fever.  Respiratory: Negative for chest tightness and shortness of breath.   Cardiovascular: Positive for leg swelling.  Gastrointestinal: Negative for nausea and vomiting.  Musculoskeletal: Positive for arthralgias, gait problem and myalgias. Negative for joint swelling.  Skin: Negative for color change, rash and wound.  Allergic/Immunologic: Negative for environmental allergies, food allergies  and immunocompromised state.  Neurological: Negative for dizziness, tremors, seizures, syncope, facial asymmetry, speech difficulty, weakness and light-headedness.  Hematological: Negative for adenopathy. Does not bruise/bleed easily.     Physical Exam Triage Vital Signs ED Triage Vitals  Enc Vitals Group     BP 01/04/21 1008 (!) 148/92     Pulse Rate 01/04/21 1008 75     Resp 01/04/21 1008 18     Temp 01/04/21 1008 98 F (36.7 C)     Temp Source 01/04/21 1008 Oral     SpO2 01/04/21 1008 98 %      Weight 01/04/21 1005 180 lb (81.6 kg)     Height 01/04/21 1005 6\' 1"  (1.854 m)     Head Circumference --      Peak Flow --      Pain Score 01/04/21 1005 10     Pain Loc --      Pain Edu? --      Excl. in GC? --    No data found.  Updated Vital Signs BP (!) 148/92 (BP Location: Right Arm)   Pulse 75   Temp 98 F (36.7 C) (Oral)   Resp 18   Ht 6\' 1"  (1.854 m)   Wt 180 lb (81.6 kg)   SpO2 98%   BMI 23.75 kg/m   Visual Acuity Right Eye Distance:   Left Eye Distance:   Bilateral Distance:    Right Eye Near:   Left Eye Near:    Bilateral Near:     Physical Exam Vitals and nursing note reviewed.  Constitutional:      General: He is awake. He is not in acute distress.    Appearance: He is well-developed and well-groomed.     Comments: He is sitting in the exam chair in no acute distress but appears uncomfortable due to pain.   HENT:     Head: Normocephalic and atraumatic.  Eyes:     Extraocular Movements: Extraocular movements intact.     Conjunctiva/sclera: Conjunctivae normal.  Cardiovascular:     Rate and Rhythm: Normal rate.  Pulmonary:     Effort: Pulmonary effort is normal.  Musculoskeletal:        General: Tenderness present.     Cervical back: Normal range of motion.     Right lower leg: Normal. No swelling, deformity or tenderness. No edema.     Left lower leg: Swelling (slight) and tenderness present. No deformity. No edema.     Right ankle: Normal.     Right Achilles Tendon: Normal.     Left ankle: Normal.     Left Achilles Tendon: Normal.       Legs:     Comments: Slight swelling present along anterior medial aspect of left patella. No redness. Slightly tender. Slight swelling present on left posterior leg from popliteal fossa to top of ankle. No distinct redness. No increase in firmness in comparison to other leg. Very tender. Dorsiflexion does increase pain in calf muscle group but any movement is painful. Good distal pulses and capillary refill. No  distinct neuro deficits noted.   Skin:    General: Skin is warm and dry.     Capillary Refill: Capillary refill takes less than 2 seconds.     Findings: No abrasion, abscess, bruising, burn, ecchymosis, erythema, laceration, lesion, petechiae, rash or wound.  Neurological:     General: No focal deficit present.     Mental Status: He is alert and oriented to person, place, and time.  Sensory: Sensation is intact. No sensory deficit.     Motor: Motor function is intact.     Comments: Difficulty ambulating due to pain- unable to apply full pressure to left leg.   Psychiatric:        Attention and Perception: Attention normal.        Mood and Affect: Mood is anxious.        Behavior: Behavior normal. Behavior is cooperative.        Thought Content: Thought content normal.        Judgment: Judgment normal.      UC Treatments / Results  Labs (all labs ordered are listed, but only abnormal results are displayed) Labs Reviewed - No data to display  EKG   Radiology No results found.  Procedures Procedures (including critical care time)  Medications Ordered in UC Medications - No data to display  Initial Impression / Assessment and Plan / UC Course  I have reviewed the triage vital signs and the nursing notes.  Pertinent labs & imaging results that were available during my care of the patient were reviewed by me and considered in my medical decision making (see chart for details).    Reviewed with patient that he probably has a muscle strain and possible ligament strain/tear. Doubt DVT since pain started almost immediately after trauma to area and no redness or warmth of calf. Discussed that x-ray not indicated since doubt fracture or other bone condition. Reviewed that he might need an MRI to diagnose ligament or muscle tear since very concerned over exact diagnosis of condition. Recommend apply ace wrap to leg for support and use crutches. Keep leg elevated as much as possible.  May take Diclofenac 75mg  every 12 hours as needed for pain and swelling. May take Flexeril 10mg  1/2 to 1 whole tablet every 8 hours as needed for muscle spasms/pain- may cause drowsiness. Note written for work. Recommend contact or go to [facility] walk-in clinic tomorrow if pain does not improve or go to the ER ASAP if pain worsens.  Final Clinical Impressions(s) / UC Diagnoses   Final diagnoses:  Left leg pain  Muscle strain     Discharge Instructions     Recommend start Diclofenac 75mg  every 12 hours as needed for pain and swelling. Start Flexeril muscle relaxer 1/2 to 1 whole tablet every 8 hours as needed for muscle spasms/pain- may cause drowsiness. Wear ace wrap for support. Use crutches to help with support. Recommend contact or go to [facility] tomorrow if pain does not improve or go to the ER ASAP if pain worsens.     ED Prescriptions    Medication Sig Dispense Auth. Provider   diclofenac (VOLTAREN) 75 MG EC tablet Take 1 tablet (75 mg total) by mouth every 12 (twelve) hours as needed for moderate pain. 20 tablet , NP   cyclobenzaprine (FLEXERIL) 10 MG tablet Take 1/2 to 1 whole tablet by mouth every 8 hours as needed for muscle spasms/pain. 15 tablet Carin Shipp, , NP     PDMP not reviewed this encounter.   , NP 01/04/21 1949

## 2021-01-04 NOTE — ED Triage Notes (Signed)
Patient states he fell going up some stairs on Friday. He is c/o pain in back of his left leg.

## 2021-01-05 DIAGNOSIS — M79662 Pain in left lower leg: Secondary | ICD-10-CM | POA: Diagnosis not present

## 2021-01-07 DIAGNOSIS — S76312A Strain of muscle, fascia and tendon of the posterior muscle group at thigh level, left thigh, initial encounter: Secondary | ICD-10-CM | POA: Diagnosis not present

## 2021-01-07 DIAGNOSIS — S8990XA Unspecified injury of unspecified lower leg, initial encounter: Secondary | ICD-10-CM | POA: Diagnosis not present

## 2021-01-07 DIAGNOSIS — S86112A Strain of other muscle(s) and tendon(s) of posterior muscle group at lower leg level, left leg, initial encounter: Secondary | ICD-10-CM | POA: Diagnosis not present

## 2021-01-11 ENCOUNTER — Ambulatory Visit: Payer: Medicaid Other | Admitting: Physician Assistant

## 2021-01-11 ENCOUNTER — Other Ambulatory Visit: Payer: Self-pay

## 2021-01-11 DIAGNOSIS — Z202 Contact with and (suspected) exposure to infections with a predominantly sexual mode of transmission: Secondary | ICD-10-CM | POA: Diagnosis not present

## 2021-01-11 DIAGNOSIS — Z113 Encounter for screening for infections with a predominantly sexual mode of transmission: Secondary | ICD-10-CM

## 2021-01-11 MED ORDER — CEFTRIAXONE SODIUM 500 MG IJ SOLR
500.0000 mg | Freq: Once | INTRAMUSCULAR | Status: AC
  Administered 2021-01-11: 500 mg via INTRAMUSCULAR

## 2021-01-11 MED ORDER — DOXYCYCLINE HYCLATE 100 MG PO TABS: 100.0000 mg | ORAL_TABLET | Freq: Two times a day (BID) | ORAL | 0 refills | Status: AC

## 2021-01-11 NOTE — Progress Notes (Signed)
Pt treated per provider orders with Ceftriaxone and Doxycycline. Provider orders completed.

## 2021-01-12 ENCOUNTER — Encounter: Payer: Self-pay | Admitting: Physician Assistant

## 2021-01-12 DIAGNOSIS — S86112D Strain of other muscle(s) and tendon(s) of posterior muscle group at lower leg level, left leg, subsequent encounter: Secondary | ICD-10-CM | POA: Diagnosis not present

## 2021-01-12 DIAGNOSIS — S76312D Strain of muscle, fascia and tendon of the posterior muscle group at thigh level, left thigh, subsequent encounter: Secondary | ICD-10-CM | POA: Diagnosis not present

## 2021-01-12 NOTE — Progress Notes (Signed)
° °  [facility] Department STI clinic/screening visit  Subjective:  Juan Hooper is a 20 y.o. male being seen today for an STI screening visit. The patient reports they do not have symptoms.    Patient has the following medical conditions:   Patient Active Problem List   Diagnosis Date Noted   ADHD (attention deficit hyperactivity disorder) 12/03/2020   Bipolar disorder (HCC) 12/03/2020   Asthma, mild intermittent 12/28/2011     Chief Complaint  Patient presents with   SEXUALLY TRANSMITTED DISEASE    screening    HPI  Patient reports that he is a contact to GC and Chlamydia.  States that he is not having any symptoms but his partner was tested and treated for both infections so he would like to also be treated.  Denies any symptoms and regular medicines.  States last HIV test was 11/2020.   See flowsheet for further details and programmatic requirements.    The following portions of the patient's history were reviewed and updated as appropriate: allergies, current medications, past medical history, past social history, past surgical history and problem list.  Objective:  There were no vitals filed for this visit.  Physical Exam Constitutional:      General: He is not in acute distress.    Appearance: Normal appearance.  HENT:     Head: Normocephalic and atraumatic.  Eyes:     Conjunctiva/sclera: Conjunctivae normal.  Pulmonary:     Effort: Pulmonary effort is normal.  Skin:    General: Skin is warm and dry.  Neurological:     Mental Status: He is alert and oriented to person, place, and time.  Psychiatric:        Mood and Affect: Mood normal.        Behavior: Behavior normal.        Thought Content: Thought content normal.        Judgment: Judgment normal.       Assessment and Plan:  Juan Hooper is a 20 y.o. male presenting to the [facility] Department for STI screening  1. Screening for STD (sexually transmitted  disease) Patient into clinic without symptoms. Patient declines screening exam/testing and blood work today.  Requests treatment only.  Rec condoms with all sex. .  2. Venereal disease contact Treat as a contact to GC and Chlamydia with Ceftriaxone 500 mg IM and Doxycycline 100 mg #14 1 po BID for 7 days. No sex for 14 days and until after partner completes treatment. Call with questions or concerns. - cefTRIAXone (ROCEPHIN) injection 500 mg - doxycycline (VIBRA-TABS) 100 MG tablet; Take 1 tablet (100 mg total) by mouth 2 (two) times daily for 7 days.  Dispense: 14 tablet; Refill: 0     No follow-ups on file.  No future appointments.  Matt Holmes, PA

## 2021-02-02 DIAGNOSIS — F319 Bipolar disorder, unspecified: Secondary | ICD-10-CM | POA: Diagnosis not present

## 2021-02-02 DIAGNOSIS — F331 Major depressive disorder, recurrent, moderate: Secondary | ICD-10-CM | POA: Diagnosis not present

## 2021-02-02 DIAGNOSIS — F5104 Psychophysiologic insomnia: Secondary | ICD-10-CM | POA: Diagnosis not present

## 2021-02-02 DIAGNOSIS — F902 Attention-deficit hyperactivity disorder, combined type: Secondary | ICD-10-CM | POA: Diagnosis not present

## 2021-04-30 DIAGNOSIS — F331 Major depressive disorder, recurrent, moderate: Secondary | ICD-10-CM | POA: Diagnosis not present

## 2021-04-30 DIAGNOSIS — F319 Bipolar disorder, unspecified: Secondary | ICD-10-CM | POA: Diagnosis not present

## 2023-04-17 ENCOUNTER — Telehealth: Payer: Self-pay

## 2023-04-17 NOTE — Telephone Encounter (Signed)
LVM for patient to call back. AS, CMA 

## 2023-04-25 DIAGNOSIS — Z1331 Encounter for screening for depression: Secondary | ICD-10-CM | POA: Diagnosis not present

## 2023-04-25 DIAGNOSIS — Z1330 Encounter for screening examination for mental health and behavioral disorders, unspecified: Secondary | ICD-10-CM | POA: Diagnosis not present

## 2023-04-25 DIAGNOSIS — Z0000 Encounter for general adult medical examination without abnormal findings: Secondary | ICD-10-CM | POA: Diagnosis not present

## 2023-04-25 DIAGNOSIS — Z23 Encounter for immunization: Secondary | ICD-10-CM | POA: Diagnosis not present

## 2023-04-25 DIAGNOSIS — Z113 Encounter for screening for infections with a predominantly sexual mode of transmission: Secondary | ICD-10-CM | POA: Diagnosis not present

## 2023-07-10 ENCOUNTER — Ambulatory Visit
Admission: EM | Admit: 2023-07-10 | Discharge: 2023-07-10 | Disposition: A | Discharge status: Home / Self Care | Payer: Medicaid Other | Attending: Emergency Medicine | Admitting: Emergency Medicine

## 2023-07-10 DIAGNOSIS — B9789 Other viral agents as the cause of diseases classified elsewhere: Secondary | ICD-10-CM | POA: Diagnosis not present

## 2023-07-10 DIAGNOSIS — F17290 Nicotine dependence, other tobacco product, uncomplicated: Secondary | ICD-10-CM | POA: Insufficient documentation

## 2023-07-10 DIAGNOSIS — Z1152 Encounter for screening for COVID-19: Secondary | ICD-10-CM | POA: Diagnosis not present

## 2023-07-10 DIAGNOSIS — J4520 Mild intermittent asthma, uncomplicated: Secondary | ICD-10-CM | POA: Diagnosis not present

## 2023-07-10 DIAGNOSIS — F17210 Nicotine dependence, cigarettes, uncomplicated: Secondary | ICD-10-CM | POA: Insufficient documentation

## 2023-07-10 DIAGNOSIS — J069 Acute upper respiratory infection, unspecified: Secondary | ICD-10-CM | POA: Insufficient documentation

## 2023-07-10 DIAGNOSIS — J029 Acute pharyngitis, unspecified: Secondary | ICD-10-CM | POA: Diagnosis present

## 2023-07-10 LAB — GROUP A STREP BY PCR: Group A Strep by PCR: NOT DETECTED

## 2023-07-10 LAB — SARS CORONAVIRUS 2 BY RT PCR: SARS Coronavirus 2 by RT PCR: NEGATIVE

## 2023-07-10 MED ORDER — PROMETHAZINE-DM 6.25-15 MG/5ML PO SYRP: 5.0000 mL | ORAL_SOLUTION | Freq: Four times a day (QID) | ORAL | 0 refills | Status: DC | PRN

## 2023-07-10 MED ORDER — IPRATROPIUM BROMIDE 0.06 % NA SOLN: 2.0000 | Freq: Four times a day (QID) | NASAL | 12 refills | Status: DC

## 2023-07-10 MED ORDER — BENZONATATE 100 MG PO CAPS: 200.0000 mg | ORAL_CAPSULE | Freq: Three times a day (TID) | ORAL | 0 refills | Status: DC

## 2023-07-10 NOTE — ED Triage Notes (Signed)
Pt c/o sore throat,cough & congestion x2 days. Denies any fevers. States he recently returned from vacation & sx's worsened.

## 2023-07-10 NOTE — Discharge Instructions (Signed)
Your testing today was negative for both COVID and strep.  I do believe you have a respiratory virus which is causing your symptoms.  Use over-the-counter Tylenol and/or ibuprofen according to package instructions as needed for pain.  You may also gargle with warm salt water by mixing 1 tablespoon of table salt in 8 ounces of warm water, gargle and spit.  You can do this as often as needed to help soothe your throat.  Use the Atrovent nasal spray, 2 squirts in each nostril every 6 hours, as needed for runny nose and postnasal drip.  Use the Tessalon Perles every 8 hours during the day.  Take them with a small sip of water.  They may give you some numbness to the base of your tongue or a metallic taste in your mouth, this is normal.  Use the Promethazine DM cough syrup at bedtime for cough and congestion.  It will make you drowsy so do not take it during the day.  Return for reevaluation or see your primary care provider for any new or worsening symptoms.

## 2023-07-10 NOTE — ED Provider Notes (Signed)
[facility]    CSN: [id number] Arrival date & time: 07/10/23  0901      History   Chief Complaint Chief Complaint  Patient presents with   Sore Throat   Nasal Congestion   Cough    HPI Juan Hooper is a 22 y.o. male.   HPI  22 year old male with a past medical history significant for ADHD, bipolar disorder, and mild intermittent asthma presents for evaluation of 5 days worth of respiratory symptoms.  His symptoms include nasal congestion, sore throat, and cough that is intermittently productive for yellow sputum.  He also endorses some intermittent wheezing.  He denies fever, nasal discharge, or shortness of breath.  No known sick contacts.  Patient requesting work note.  Past Medical History:  Diagnosis Date   Asthma     Patient Active Problem List   Diagnosis Date Noted   ADHD (attention deficit hyperactivity disorder) 12/03/2020   Bipolar disorder (HCC) 12/03/2020   Asthma, mild intermittent 12/28/2011    Past Surgical History:  Procedure Laterality Date   NO PAST SURGERIES     OPEN REDUCTION INTERNAL FIXATION (ORIF) METACARPAL Left 06/14/2018   Procedure: OPEN REDUCTION INTERNAL FIXATION (ORIF) METACARPAL;  Surgeon: Kennedy Bucker, MD;  Location: [facility] ORS;  Service: Orthopedics;  Laterality: Left;       Home Medications    Prior to Admission medications   Medication Sig Start Date End Date Taking? Authorizing Provider  benzonatate (TESSALON) 100 MG capsule Take 2 capsules (200 mg total) by mouth every 8 (eight) hours. 07/10/23  Yes Becky Augusta, NP  ipratropium (ATROVENT) 0.06 % nasal spray Place 2 sprays into both nostrils 4 (four) times daily. 07/10/23  Yes Becky Augusta, NP  promethazine-dextromethorphan (PROMETHAZINE-DM) 6.25-15 MG/5ML syrup Take 5 mLs by mouth 4 (four) times daily as needed. 07/10/23  Yes Becky Augusta, NP  PROAIR HFA 108 [phone] Base) MCG/ACT inhaler SMARTSIG:2 Inhalation Via Inhaler Every 6 Hours PRN 11/16/20 01/04/21  [provider]    Family History Family History  Problem Relation Age of Onset   Diabetes Mother    Stroke Father     Social History Social History   Tobacco Use   Smoking status: Some Days    Current packs/day: 0.50    Types: Cigars, Cigarettes   Smokeless tobacco: Former  Building services engineer status: Former  Substance Use Topics   Alcohol use: Not Currently   Drug use: Yes    Types: Marijuana     Allergies   Patient has no known allergies.   Review of Systems Review of Systems  Constitutional:  Negative for fever.  HENT:  Positive for congestion and sore throat. Negative for ear pain and rhinorrhea.   Respiratory:  Positive for cough and wheezing. Negative for shortness of breath.      Physical Exam Triage Vital Signs ED Triage Vitals [07/10/23 0925]  Encounter Vitals Group     BP      Systolic BP Percentile      Diastolic BP Percentile      Pulse      Resp 16     Temp      Temp Source Oral     SpO2      Weight      Height      Head Circumference      Peak Flow      Pain Score      Pain Loc      Pain Education  Exclude from Growth Chart    No data found.  Updated Vital Signs BP (!) 152/95 (BP Location: Right Arm)   Pulse 65   Temp 98.7 F (37.1 C) (Oral)   Resp 16   Ht 6' (1.829 m)   Wt 220 lb (99.8 kg)   SpO2 98%   BMI 29.84 kg/m   Visual Acuity Right Eye Distance:   Left Eye Distance:   Bilateral Distance:    Right Eye Near:   Left Eye Near:    Bilateral Near:     Physical Exam Vitals and nursing note reviewed.  Constitutional:      Appearance: Normal appearance. He is not ill-appearing.  HENT:     Head: Normocephalic and atraumatic.     Right Ear: Tympanic membrane, ear canal and external ear normal. There is no impacted cerumen.     Left Ear: Tympanic membrane, ear canal and external ear normal. There is no impacted cerumen.     Nose: Congestion and rhinorrhea present.     Comments: His mucosa is erythematous and  edematous with scant clear discharge in both nares.    Mouth/Throat:     Mouth: Mucous membranes are moist.     Pharynx: Oropharynx is clear. Posterior oropharyngeal erythema present. No oropharyngeal exudate.     Comments: Bilateral tonsillar pillars are 1+ edematous and erythematous but free of exudate.  There is erythema to the soft palate as well. Cardiovascular:     Rate and Rhythm: Normal rate and regular rhythm.     Pulses: Normal pulses.     Heart sounds: Normal heart sounds. No murmur heard.    No friction rub. No gallop.  Pulmonary:     Effort: Pulmonary effort is normal.     Breath sounds: Normal breath sounds. No wheezing, rhonchi or rales.  Musculoskeletal:     Cervical back: Normal range of motion and neck supple.  Lymphadenopathy:     Cervical: No cervical adenopathy.  Skin:    General: Skin is warm and dry.     Capillary Refill: Capillary refill takes less than 2 seconds.  Neurological:     General: No focal deficit present.     Mental Status: He is alert and oriented to person, place, and time.      UC Treatments / Results  Labs (all labs ordered are listed, but only abnormal results are displayed) Labs Reviewed  GROUP A STREP BY PCR  SARS CORONAVIRUS 2 BY RT PCR    EKG   Radiology No results found.  Procedures Procedures (including critical care time)  Medications Ordered in UC Medications - No data to display  Initial Impression / Assessment and Plan / UC Course  I have reviewed the triage vital signs and the nursing notes.  Pertinent labs & imaging results that were available during my care of the patient were reviewed by me and considered in my medical decision making (see chart for details).   Patient is a nontoxic-appearing 22 year old male presenting for evaluation of 5 days with respiratory symptoms as outlined in HPI above.  He has not measured a fever and he is afebrile in clinic.  He does have erythema to the soft palate and bilateral  tonsillar pillars but no appreciable exudate.  I will order a strep PCR.  He initially reported that he had symptoms for 2 days but upon clarification he reports he did not had symptoms for 5 days.  A COVID PCR was collected at triage and is pending.  I have advised the patient that if he test turned up COVID-positive he is on day 5 so he will only need to wear a mask for today and then he will be outside of his infectious window.  He has not been febrile so there is no need to quarantine.  Strep PCR is negative.  COVID PCR is negative.  I will discharge patient on the diagnosis of viral URI with cough and treated with Atrovent nasal spray, Tessalon Perles, Promethazine DM cough syrup.  He may use over-the-counter Tylenol and or ibuprofen as needed for pain along with salt water gargles to soothe his throat.  Work note provided.   Final Clinical Impressions(s) / UC Diagnoses   Final diagnoses:  Viral URI with cough     Discharge Instructions      Your testing today was negative for both COVID and strep.  I do believe you have a respiratory virus which is causing your symptoms.  Use over-the-counter Tylenol and/or ibuprofen according to package instructions as needed for pain.  You may also gargle with warm salt water by mixing 1 tablespoon of table salt in 8 ounces of warm water, gargle and spit.  You can do this as often as needed to help soothe your throat.  Use the Atrovent nasal spray, 2 squirts in each nostril every 6 hours, as needed for runny nose and postnasal drip.  Use the Tessalon Perles every 8 hours during the day.  Take them with a small sip of water.  They may give you some numbness to the base of your tongue or a metallic taste in your mouth, this is normal.  Use the Promethazine DM cough syrup at bedtime for cough and congestion.  It will make you drowsy so do not take it during the day.  Return for reevaluation or see your primary care provider for any new or  worsening symptoms.      ED Prescriptions     Medication Sig Dispense Auth. Provider   benzonatate (TESSALON) 100 MG capsule Take 2 capsules (200 mg total) by mouth every 8 (eight) hours. 21 capsule Becky Augusta, NP   ipratropium (ATROVENT) 0.06 % nasal spray Place 2 sprays into both nostrils 4 (four) times daily. 15 mL Becky Augusta, NP   promethazine-dextromethorphan (PROMETHAZINE-DM) 6.25-15 MG/5ML syrup Take 5 mLs by mouth 4 (four) times daily as needed. 118 mL Becky Augusta, NP      PDMP not reviewed this encounter.   Becky Augusta, NP 07/10/23 1027

## 2023-10-26 ENCOUNTER — Emergency Department: Payer: Medicaid Other

## 2023-10-26 ENCOUNTER — Emergency Department
Admission: EM | Admit: 2023-10-26 | Discharge: 2023-10-26 | Disposition: A | Discharge status: Home / Self Care | Payer: Medicaid Other | Attending: Emergency Medicine | Admitting: Emergency Medicine

## 2023-10-26 ENCOUNTER — Other Ambulatory Visit: Payer: Self-pay

## 2023-10-26 DIAGNOSIS — R1011 Right upper quadrant pain: Secondary | ICD-10-CM | POA: Insufficient documentation

## 2023-10-26 DIAGNOSIS — Z20822 Contact with and (suspected) exposure to covid-19: Secondary | ICD-10-CM | POA: Diagnosis not present

## 2023-10-26 DIAGNOSIS — R110 Nausea: Secondary | ICD-10-CM | POA: Insufficient documentation

## 2023-10-26 DIAGNOSIS — R0781 Pleurodynia: Secondary | ICD-10-CM | POA: Insufficient documentation

## 2023-10-26 DIAGNOSIS — K760 Fatty (change of) liver, not elsewhere classified: Secondary | ICD-10-CM | POA: Diagnosis not present

## 2023-10-26 LAB — COMPREHENSIVE METABOLIC PANEL
ALT: 49 U/L — ABNORMAL HIGH (ref 0–44)
AST: 28 U/L (ref 15–41)
Albumin: 4.2 g/dL (ref 3.5–5.0)
Alkaline Phosphatase: 78 U/L (ref 38–126)
Anion gap: 9 (ref 5–15)
BUN: 12 mg/dL (ref 6–20)
CO2: 25 mmol/L (ref 22–32)
Calcium: 9.4 mg/dL (ref 8.9–10.3)
Chloride: 103 mmol/L (ref 98–111)
Creatinine, Ser: 1.03 mg/dL (ref 0.61–1.24)
GFR, Estimated: >60 mL/min (ref >60)
Glucose, Bld: 110 mg/dL — ABNORMAL HIGH (ref 70–99)
Potassium: 4.2 mmol/L (ref 3.5–5.1)
Sodium: 137 mmol/L (ref 135–145)
Total Bilirubin: 0.4 mg/dL (ref <1.2)
Total Protein: 8 g/dL (ref 6.5–8.1)

## 2023-10-26 LAB — TROPONIN I (HIGH SENSITIVITY): Troponin I (High Sensitivity): 5 ng/L (ref <18)

## 2023-10-26 LAB — RESP PANEL BY RT-PCR (RSV, FLU A&B, COVID)  RVPGX2
Influenza A by PCR: NEGATIVE
Influenza B by PCR: NEGATIVE
Resp Syncytial Virus by PCR: NEGATIVE
SARS Coronavirus 2 by RT PCR: NEGATIVE

## 2023-10-26 LAB — CBC
HCT: 47.8 % (ref 39.0–52.0)
Hemoglobin: 16.4 g/dL (ref 13.0–17.0)
MCH: 29.3 pg (ref 26.0–34.0)
MCHC: 34.3 g/dL (ref 30.0–36.0)
MCV: 85.5 fL (ref 80.0–100.0)
Platelets: 231 10*3/uL (ref 150–400)
RBC: 5.59 MIL/uL (ref 4.22–5.81)
RDW: 13.1 % (ref 11.5–15.5)
WBC: 9.6 10*3/uL (ref 4.0–10.5)
nRBC: 0 % (ref 0.0–0.2)

## 2023-10-26 MED ORDER — IBUPROFEN 600 MG PO TABS
600.0000 mg | ORAL_TABLET | Freq: Once | ORAL | Status: AC
  Administered 2023-10-26: 600 mg via ORAL
  Filled 2023-10-26: qty 1

## 2023-10-26 MED ORDER — ACETAMINOPHEN 500 MG PO TABS
1000.0000 mg | ORAL_TABLET | Freq: Once | ORAL | Status: AC
  Administered 2023-10-26: 1000 mg via ORAL
  Filled 2023-10-26: qty 2

## 2023-10-26 NOTE — Discharge Instructions (Signed)
I would like you to take scheduled ibuprofen 600 mg every 8 hours for the next 5 days and then take as needed.  Please follow-up with your primary care provider.

## 2023-10-26 NOTE — ED Triage Notes (Signed)
Pt reports R sided rib pain x 2 days. Denies fall or injury. Reports has gradually worsened and now hurts to take a deep breath. Pt now reporting body aches, fatigue, and headache. Also reports mild chest pain, worse with movement and deep breathing. Pt ambulatory to triage. Alert and oriented. Breathing unlabored speaking in full sentences.

## 2023-10-26 NOTE — ED Provider Notes (Signed)
[facility] Provider Note    Event Date/Time   First MD Initiated Contact with Patient 10/26/23 2024     (approximate)   History   ribcage pain    HPI Juan Hooper is a 22 y.o. male presenting today for right lower rib and right upper abdominal pain that has been intermittent over the past 2 days.  Patient states pain worsens after eating.  He has had some episodes of nausea but no episodes of vomiting.  Denies any other chest pain or shortness of breath.  Has felt fevers and chills.  Denies constipation, diarrhea, dysuria, hematuria.  Did note pain when lifting his daughter up and felt like he was muscular.     Physical Exam   Triage Vital Signs: ED Triage Vitals  Encounter Vitals Group     BP 10/26/23 1935 (!) 153/90     Systolic BP Percentile --      Diastolic BP Percentile --      Pulse Rate 10/26/23 1935 100     Resp 10/26/23 1935 20     Temp --      Temp src --      SpO2 10/26/23 1935 98 %     Weight 10/26/23 1933 230 lb (104.3 kg)     Height 10/26/23 1933 6' (1.829 m)     Head Circumference --      Peak Flow --      Pain Score 10/26/23 1933 7     Pain Loc --      Pain Education --      Exclude from Growth Chart --     Most recent vital signs: Vitals:   10/26/23 2115 10/26/23 2243  BP:  (!) 124/96  Pulse:  85  Resp:  15  Temp: 99.5 F (37.5 C) 98 F (36.7 C)  SpO2:  96%   Physical Exam: I have reviewed the vital signs and nursing notes. General: Awake, alert, no acute distress.  Nontoxic appearing. Head:  Atraumatic, normocephalic.   ENT:  EOM intact, PERRL. Oral mucosa is pink and moist with no lesions. Neck: Neck is supple with full range of motion, No meningeal signs. Cardiovascular:  RRR, No murmurs. Peripheral pulses palpable and equal bilaterally. Respiratory:  Symmetrical chest wall expansion.  No rhonchi, rales, or wheezes.  Good air movement throughout.  No use of accessory muscles.   Musculoskeletal:  No cyanosis  or edema. Moving extremities with full ROM.  Tenderness palpation along the cartilage and rib cage of the right lower side. Abdomen:  Soft, tenderness to palpation in right upper quadrant, nondistended.  No CVA tenderness bilaterally. Neuro:  GCS 15, moving all four extremities, interacting appropriately. Speech clear. Psych:  Calm, appropriate.  Skin:  Warm, dry, no rash.    ED Results / Procedures / Treatments   Labs (all labs ordered are listed, but only abnormal results are displayed) Labs Reviewed  COMPREHENSIVE METABOLIC PANEL - Abnormal; Notable for the following components:      Result Value   Glucose, Bld 110 (*)    ALT 49 (*)    All other components within normal limits  RESP PANEL BY RT-PCR (RSV, FLU A&B, COVID)  RVPGX2  CBC  URINALYSIS, ROUTINE W REFLEX MICROSCOPIC  LIPASE, BLOOD  D-DIMER, QUANTITATIVE  TROPONIN I (HIGH SENSITIVITY)     EKG My EKG interpretation: Rate of 107, sinus tachycardia, normal axis, normal intervals.  No acute ST elevations or depressions   RADIOLOGY Independently interpreted  chest x-ray with no acute pathology.  Independently interpreted right upper quadrant ultrasound with no acute pathology   PROCEDURES:  Critical Care performed: No  Procedures   MEDICATIONS ORDERED IN ED: Medications  ibuprofen (ADVIL) tablet 600 mg (600 mg Oral Given 10/26/23 2114)  acetaminophen (TYLENOL) tablet 1,000 mg (1,000 mg Oral Given 10/26/23 2114)     IMPRESSION / MDM / ASSESSMENT AND PLAN / ED COURSE  I reviewed the triage vital signs and the nursing notes.                              Differential diagnosis includes, but is not limited to, pneumonia, pleurisy, cholecystitis, symptomatic cholelithiasis, low suspicion ACS or PE  Patient's presentation is most consistent with acute complicated illness / injury requiring diagnostic workup.  Patient is a 22 year old male presenting today for right lower rib cage and right upper quadrant  abdominal pain.  Pain symptoms have been intermittent.  Associated with nausea.  Does have tenderness in the right upper quadrant concerning for possible cholecystitis.  No other specific chest pain or shortness of breath symptoms.  Troponin negative and EKG without any signs of ischemia.  CBC and respiratory panel negative.  CMP with only mild ALT elevation.  Will get right upper quadrant ultrasound for further evaluation of possible cholecystitis.  Right upper quadrant ultrasound negative.  Had plan to get lipase and D-dimer for further evaluation.  Patient stated he did not want to wait further test results for these any had to get home.  Pain symptoms were minimum on reassessment after getting Tylenol and ibuprofen.  He is low risk and PERC and Robb Sibal score negative.  Symptoms do seem more consistent with costochondritis of the right lower rib with tenderness palpation at that site.  Emphasized ibuprofen scheduled for the next 5 days and reassessment by his primary care provider.  He was agreeable with this plan.     FINAL CLINICAL IMPRESSION(S) / ED DIAGNOSES   Final diagnoses:  Rib pain on right side     Rx / DC Orders   ED Discharge Orders     None        Note:  This document was prepared using Dragon voice recognition software and may include unintentional dictation errors.   Janith Lima, MD 10/26/23 [phone]

## 2024-01-11 ENCOUNTER — Ambulatory Visit
Admission: EM | Admit: 2024-01-11 | Discharge: 2024-01-11 | Disposition: A | Discharge status: Home / Self Care | Payer: Medicaid Other

## 2024-01-11 ENCOUNTER — Encounter: Payer: Self-pay | Admitting: Emergency Medicine

## 2024-01-11 DIAGNOSIS — R197 Diarrhea, unspecified: Secondary | ICD-10-CM | POA: Diagnosis not present

## 2024-01-11 DIAGNOSIS — F17200 Nicotine dependence, unspecified, uncomplicated: Secondary | ICD-10-CM

## 2024-01-11 DIAGNOSIS — F1290 Cannabis use, unspecified, uncomplicated: Secondary | ICD-10-CM

## 2024-01-11 NOTE — ED Provider Notes (Signed)
 [facility]    CSN: [id number] Arrival date & time: 01/11/24  1003      History   Chief Complaint Chief Complaint  Patient presents with   Diarrhea   Nausea   Abdominal Pain    HPI Juan Hooper is a 23 y.o. male.   23 year old male pt, Juan Hooper, presents to urgent care for evaluation of diarrhea for 3 days.  Patient states after eating and drinking he has abdominal pain, nausea and diarrhea. Pt is trying ot cut weight for a fight, over 10 episodes of diarrhea in last 24 hours, no nausea/no vomiting,no fever.   Last ate chick fil a. No known illness exposure, +smoker(marijuanna and cigarettes)  The history is provided by the patient. No language interpreter was used.    Past Medical History:  Diagnosis Date   Asthma     Patient Active Problem List   Diagnosis Date Noted   Diarrhea 01/11/2024   Smoker 01/11/2024   Marijuana use 01/11/2024   ADHD (attention deficit hyperactivity disorder) 12/03/2020   Bipolar disorder (HCC) 12/03/2020   Asthma, mild intermittent 12/28/2011    Past Surgical History:  Procedure Laterality Date   NO PAST SURGERIES     OPEN REDUCTION INTERNAL FIXATION (ORIF) METACARPAL Left 06/14/2018   Procedure: OPEN REDUCTION INTERNAL FIXATION (ORIF) METACARPAL;  Surgeon: Kathlynn Sharper, MD;  Location: [facility] ORS;  Service: Orthopedics;  Laterality: Left;   TONSILLECTOMY         Home Medications    Prior to Admission medications   Medication Sig Start Date End Date Taking? Authorizing Provider  PROAIR HFA 108 [phone] Base) MCG/ACT inhaler SMARTSIG:2 Inhalation Via Inhaler Every 6 Hours PRN 11/16/20 01/04/21  [provider]    Family History Family History  Problem Relation Age of Onset   Diabetes Mother    Stroke Father     Social History Social History   Tobacco Use   Smoking status: Some Days    Current packs/day: 0.50    Types: Cigars, Cigarettes   Smokeless tobacco: Former  Building Services Engineer status:  Former  Substance Use Topics   Alcohol use: Not Currently   Drug use: Yes    Types: Marijuana     Allergies   Patient has no known allergies.   Review of Systems Review of Systems  Constitutional:  Negative for fever.  Gastrointestinal:  Positive for abdominal pain, diarrhea and nausea.  All other systems reviewed and are negative.    Physical Exam Triage Vital Signs ED Triage Vitals [01/11/24 1012]  Encounter Vitals Group     BP (!) 154/92     Systolic BP Percentile      Diastolic BP Percentile      Pulse Rate 80     Resp 18     Temp 97.9 F (36.6 C)     Temp Source Oral     SpO2 99 %     Weight      Height      Head Circumference      Peak Flow      Pain Score      Pain Loc      Pain Education      Exclude from Growth Chart    No data found.  Updated Vital Signs BP (!) 154/92 (BP Location: Left Arm)   Pulse 80   Temp 97.9 F (36.6 C) (Oral)   Resp 18   SpO2 99%   Visual Acuity  Right Eye Distance:   Left Eye Distance:   Bilateral Distance:    Right Eye Near:   Left Eye Near:    Bilateral Near:     Physical Exam Vitals and nursing note reviewed.  Constitutional:      General: He is not in acute distress.    Appearance: He is well-developed.  HENT:     Head: Normocephalic and atraumatic.  Eyes:     Conjunctiva/sclera: Conjunctivae normal.  Cardiovascular:     Rate and Rhythm: Normal rate and regular rhythm.     Heart sounds: No murmur heard. Pulmonary:     Effort: Pulmonary effort is normal. No respiratory distress.     Breath sounds: Normal breath sounds.  Abdominal:     General: Bowel sounds are increased.     Palpations: Abdomen is soft.     Tenderness: There is no abdominal tenderness. There is no guarding or rebound.  Musculoskeletal:        General: No swelling.     Cervical back: Neck supple.  Skin:    General: Skin is warm and dry.     Capillary Refill: Capillary refill takes less than 2 seconds.  Neurological:     General:  No focal deficit present.     Mental Status: He is alert and oriented to person, place, and time.     GCS: GCS eye subscore is 4. GCS verbal subscore is 5. GCS motor subscore is 6.     Cranial Nerves: No cranial nerve deficit.     Sensory: No sensory deficit.  Psychiatric:        Attention and Perception: Attention normal.        Mood and Affect: Mood normal.        Speech: Speech normal.        Behavior: Behavior normal.      UC Treatments / Results  Labs (all labs ordered are listed, but only abnormal results are displayed) Labs Reviewed - No data to display  EKG   Radiology No results found.  Procedures Procedures (including critical care time)  Medications Ordered in UC Medications - No data to display  Initial Impression / Assessment and Plan / UC Course  I have reviewed the triage vital signs and the nursing notes.  Pertinent labs & imaging results that were available during my care of the patient were reviewed by me and considered in my medical decision making (see chart for details).    Discussed exam findings and plan of care with patient, strict go to ER precautions given.   Patient verbalized understanding to this provider.  Ddx: Diarrhea, viral illness, smoker,marijuana use Final Clinical Impressions(s) / UC Diagnoses   Final diagnoses:  Diarrhea, unspecified type  Smoker  Marijuana use     Discharge Instructions      Clear liquid diet for 24 hours Advance to bland diet Push fluids: pedilyte,IV hydration,gatorade, popsicles,jello,etc. GO to Er for new or worsening issues or concerns      ED Prescriptions   None    PDMP not reviewed this encounter.   Aminta Loose, NP 01/11/24 [phone]

## 2024-01-11 NOTE — ED Triage Notes (Signed)
 Patient c/o diarrhea x 3 days. Patient states after eating or drinking he immediately has abdominal pain, nausea and diarrhea.

## 2024-01-11 NOTE — Discharge Instructions (Addendum)
 Clear liquid diet for 24 hours Advance to bland diet Push fluids: pedilyte,IV hydration,gatorade, popsicles,jello,etc. GO to Er for new or worsening issues or concerns

## 2024-02-02 ENCOUNTER — Ambulatory Visit
Admission: EM | Admit: 2024-02-02 | Discharge: 2024-02-02 | Disposition: A | Discharge status: Home / Self Care | Payer: Medicaid Other

## 2024-02-02 DIAGNOSIS — L089 Local infection of the skin and subcutaneous tissue, unspecified: Secondary | ICD-10-CM

## 2024-02-02 MED ORDER — DOXYCYCLINE HYCLATE 100 MG PO CAPS: 100.0000 mg | ORAL_CAPSULE | Freq: Two times a day (BID) | ORAL | 0 refills | Status: AC

## 2024-02-02 MED ORDER — MUPIROCIN 2 % EX OINT: 1.0000 | TOPICAL_OINTMENT | Freq: Two times a day (BID) | CUTANEOUS | 0 refills | Status: DC

## 2024-02-02 NOTE — ED Triage Notes (Signed)
 Pt present an boil  on the lower part of his back, right in the crack of his buttocks area. Symptoms started about two- three months ago. Pt states the area will bust/drain and scabs back over and re bust again.

## 2024-02-02 NOTE — ED Provider Notes (Signed)
 [facility]    CSN: [id number] Arrival date & time: 02/02/24  1055      History   Chief Complaint Chief Complaint  Patient presents with   Recurrent Skin Infections    HPI Juan Hooper is a 23 y.o. male presenting for a recurrent area of erythema and swelling which causes drainage over the past several months.  Patient says it often opens on its own, drains pus, scabs over and fills back up.  He is concerned about potential infection.  He has never been diagnosed with any skin infections.  She has been trying to keep the area clean.  No treatments attempted.  No other complaints.  HPI  Past Medical History:  Diagnosis Date   Asthma     Patient Active Problem List   Diagnosis Date Noted   Diarrhea 01/11/2024   Smoker 01/11/2024   Marijuana use 01/11/2024   ADHD (attention deficit hyperactivity disorder) 12/03/2020   Bipolar disorder (HCC) 12/03/2020   Asthma, mild intermittent 12/28/2011    Past Surgical History:  Procedure Laterality Date   NO PAST SURGERIES     OPEN REDUCTION INTERNAL FIXATION (ORIF) METACARPAL Left 06/14/2018   Procedure: OPEN REDUCTION INTERNAL FIXATION (ORIF) METACARPAL;  Surgeon: Kennedy Bucker, MD;  Location: [facility] ORS;  Service: Orthopedics;  Laterality: Left;   TONSILLECTOMY         Home Medications    Prior to Admission medications   Medication Sig Start Date End Date Taking? Authorizing Provider  albuterol (VENTOLIN HFA) 108 (90 Base) MCG/ACT inhaler Inhale 1-2 puffs into the lungs every 6 (six) hours as needed for wheezing or shortness of breath.   Yes [provider]  doxycycline (VIBRAMYCIN) 100 MG capsule Take 1 capsule (100 mg total) by mouth 2 (two) times daily for 7 days. 02/02/24 02/09/24 Yes Shirlee Latch, PA-C  mupirocin ointment (BACTROBAN) 2 % Apply 1 Application topically 2 (two) times daily. 02/02/24  Yes Shirlee Latch, PA-C    Family History Family History  Problem Relation Age of Onset    Diabetes Mother    Stroke Father     Social History Social History   Tobacco Use   Smoking status: Some Days    Current packs/day: 0.50    Types: Cigars, Cigarettes   Smokeless tobacco: Former  Building services engineer status: Former  Substance Use Topics   Alcohol use: Not Currently   Drug use: Yes    Types: Marijuana     Allergies   Patient has no known allergies.   Review of Systems Review of Systems  Constitutional:  Negative for fatigue and fever.  Skin:  Positive for color change and wound. Negative for rash.  Neurological:  Negative for weakness.     Physical Exam Triage Vital Signs ED Triage Vitals  Encounter Vitals Group     BP      Systolic BP Percentile      Diastolic BP Percentile      Pulse      Resp      Temp      Temp src      SpO2      Weight      Height      Head Circumference      Peak Flow      Pain Score      Pain Loc      Pain Education      Exclude from Growth Chart    No data  found.  Updated Vital Signs BP 134/87 (BP Location: Left Arm)   Pulse 76   Temp 98 F (36.7 C) (Oral)   Resp 18   SpO2 98%     Physical Exam Vitals and nursing note reviewed.  Constitutional:      General: He is not in acute distress.    Appearance: Normal appearance. He is well-developed. He is not ill-appearing.  HENT:     Head: Normocephalic and atraumatic.  Eyes:     General: No scleral icterus.    Conjunctiva/sclera: Conjunctivae normal.  Cardiovascular:     Rate and Rhythm: Normal rate.  Pulmonary:     Effort: Pulmonary effort is normal. No respiratory distress.  Musculoskeletal:     Cervical back: Neck supple.  Skin:    General: Skin is warm and dry.     Capillary Refill: Capillary refill takes less than 2 seconds.     Findings: Erythema present.     Comments: There is a small area (1.5 cm x 1.5 cm) of erythema and induration without fluctuance of lower back. Area is mildly TTP.   Neurological:     General: No focal deficit present.      Mental Status: He is alert. Mental status is at baseline.     Motor: No weakness.     Gait: Gait normal.  Psychiatric:        Mood and Affect: Mood normal.        Behavior: Behavior normal.      UC Treatments / Results  Labs (all labs ordered are listed, but only abnormal results are displayed) Labs Reviewed - No data to display  EKG   Radiology No results found.  Procedures Procedures (including critical care time)  Medications Ordered in UC Medications - No data to display  Initial Impression / Assessment and Plan / UC Course  I have reviewed the triage vital signs and the nursing notes.  Pertinent labs & imaging results that were available during my care of the patient were reviewed by me and considered in my medical decision making (see chart for details).   23 year old male presents for an area of redness, swelling and pustular drainage of the lower back which has been intermittent over the past couple of months.  He has not been treated for this.  On exam he has a small abscess/cyst without fluctuance or drainage.  Will treat at this time with doxycycline and mupirocin ointment.  Advised good hygiene, warm compresses.  Reviewed return precautions.   Final Clinical Impressions(s) / UC Diagnoses   Final diagnoses:  Recurrent infection of skin     Discharge Instructions      -Continue to clean area regularly.  Use warm compresses to help the area drain. - Take antibiotics and use topical ointment twice daily.  It should hopefully resolve after the antibiotics.  If it does not, there could be an underlying cyst which might need drainage.     ED Prescriptions     Medication Sig Dispense Auth. Provider   doxycycline (VIBRAMYCIN) 100 MG capsule Take 1 capsule (100 mg total) by mouth 2 (two) times daily for 7 days. 14 capsule Eusebio Friendly B, PA-C   mupirocin ointment (BACTROBAN) 2 % Apply 1 Application topically 2 (two) times daily. 22 g Shirlee Latch,  PA-C      PDMP not reviewed this encounter.   Shirlee Latch, PA-C 02/02/24 1209

## 2024-02-02 NOTE — Discharge Instructions (Addendum)
-  Continue to clean area regularly.  Use warm compresses to help the area drain. - Take antibiotics and use topical ointment twice daily.  It should hopefully resolve after the antibiotics.  If it does not, there could be an underlying cyst which might need drainage.

## 2024-02-28 ENCOUNTER — Ambulatory Visit
Admission: EM | Admit: 2024-02-28 | Discharge: 2024-02-28 | Disposition: A | Discharge status: Home / Self Care | Attending: Family Medicine | Admitting: Family Medicine

## 2024-02-28 ENCOUNTER — Encounter: Payer: Self-pay | Admitting: Emergency Medicine

## 2024-02-28 ENCOUNTER — Ambulatory Visit (INDEPENDENT_AMBULATORY_CARE_PROVIDER_SITE_OTHER)

## 2024-02-28 DIAGNOSIS — R0781 Pleurodynia: Secondary | ICD-10-CM | POA: Diagnosis not present

## 2024-02-28 DIAGNOSIS — S20212A Contusion of left front wall of thorax, initial encounter: Secondary | ICD-10-CM

## 2024-02-28 NOTE — Discharge Instructions (Signed)
 Continue ibuprofen and Tylenol.  Continue cool compresses as well.  Please follow-up with your PCP if your symptoms do not improve.  Please go to the ER for any worsening symptoms.  Hope you feel better soon!

## 2024-02-28 NOTE — ED Provider Notes (Signed)
 [facility]    CSN: [id number] Arrival date & time: 02/28/24  1415      History   Chief Complaint Chief Complaint  Patient presents with   rib cage pain     HPI Juan Hooper is a 24 y.o. male presents for rib pain.  Patient reports 2 weeks ago he was kneed in his left anterior ribs during a martial arts training exercise.  Reports he had some swelling initially without bruising that is since resolved.  He continues to have left lower anterior rib pain with deep breathing, movement and activity.  Denies any shortness of breath or hemoptysis.  Does report a history of a broken rib on that side in the past.  Has been doing ibuprofen and rest for his symptoms.  No other injuries or concerns at this time.  HPI  Past Medical History:  Diagnosis Date   Asthma     Patient Active Problem List   Diagnosis Date Noted   Diarrhea 01/11/2024   Smoker 01/11/2024   Marijuana use 01/11/2024   ADHD (attention deficit hyperactivity disorder) 12/03/2020   Bipolar disorder (HCC) 12/03/2020   Asthma, mild intermittent 12/28/2011    Past Surgical History:  Procedure Laterality Date   NO PAST SURGERIES     OPEN REDUCTION INTERNAL FIXATION (ORIF) METACARPAL Left 06/14/2018   Procedure: OPEN REDUCTION INTERNAL FIXATION (ORIF) METACARPAL;  Surgeon: Kennedy Bucker, MD;  Location: [facility] ORS;  Service: Orthopedics;  Laterality: Left;   TONSILLECTOMY         Home Medications    Prior to Admission medications   Medication Sig Start Date End Date Taking? Authorizing Provider  albuterol (VENTOLIN HFA) 108 (90 Base) MCG/ACT inhaler Inhale 1-2 puffs into the lungs every 6 (six) hours as needed for wheezing or shortness of breath.    [provider]  mupirocin ointment (BACTROBAN) 2 % Apply 1 Application topically 2 (two) times daily. 02/02/24   Shirlee Latch, PA-C    Family History Family History  Problem Relation Age of Onset   Diabetes Mother    Stroke Father      Social History Social History   Tobacco Use   Smoking status: Some Days    Current packs/day: 0.50    Types: Cigars, Cigarettes   Smokeless tobacco: Former  Building services engineer status: Former  Substance Use Topics   Alcohol use: Not Currently   Drug use: Yes    Types: Marijuana     Allergies   Patient has no known allergies.   Review of Systems Review of Systems  Musculoskeletal:        Left rib pain/injury     Physical Exam Triage Vital Signs ED Triage Vitals  Encounter Vitals Group     BP 02/28/24 1446 (!) 160/98     Systolic BP Percentile --      Diastolic BP Percentile --      Pulse Rate 02/28/24 1446 69     Resp 02/28/24 1446 18     Temp 02/28/24 1446 98.3 F (36.8 C)     Temp Source 02/28/24 1446 Oral     SpO2 --      Weight --      Height --      Head Circumference --      Peak Flow --      Pain Score 02/28/24 1445 5     Pain Loc --      Pain Education --  Exclude from Growth Chart --    No data found.  Updated Vital Signs BP (!) 160/98 (BP Location: Left Arm)   Pulse 69   Temp 98.3 F (36.8 C) (Oral)   Resp 18   Visual Acuity Right Eye Distance:   Left Eye Distance:   Bilateral Distance:    Right Eye Near:   Left Eye Near:    Bilateral Near:     Physical Exam Vitals and nursing note reviewed.  Constitutional:      General: He is not in acute distress.    Appearance: Normal appearance. He is not ill-appearing.  HENT:     Head: Normocephalic and atraumatic.  Eyes:     Pupils: Pupils are equal, round, and reactive to light.  Cardiovascular:     Rate and Rhythm: Normal rate.  Pulmonary:     Effort: Pulmonary effort is normal. No respiratory distress.     Breath sounds: Normal breath sounds. No stridor. No wheezing, rhonchi or rales.  Chest:     Chest wall: Tenderness present. No mass, lacerations, deformity, swelling, crepitus or edema. There is no dullness to percussion.       Comments: There is no swelling or  ecchymosis of the left chest/ribs.  Tender to palpation to the left lower anterior ribs.  Equal chest expansion bilaterally. Skin:    General: Skin is warm and dry.  Neurological:     General: No focal deficit present.     Mental Status: He is alert and oriented to person, place, and time.  Psychiatric:        Mood and Affect: Mood normal.        Behavior: Behavior normal.      UC Treatments / Results  Labs (all labs ordered are listed, but only abnormal results are displayed) Labs Reviewed - No data to display  EKG   Radiology No results found.  Procedures Procedures (including critical care time)  Medications Ordered in UC Medications - No data to display  Initial Impression / Assessment and Plan / UC Course  I have reviewed the triage vital signs and the nursing notes.  Pertinent labs & imaging results that were available during my care of the patient were reviewed by me and considered in my medical decision making (see chart for details).  Clinical Course as of 02/28/24 1600  Wed Feb 28, 2024  1600 O2 98% on RA [JM]    Clinical Course User Index [JM] Radford Pax, NP    Reviewed exam and symptoms with patient.  No red flags.  Wet read of x-ray without obvious fracture, will contact for any positive results based on radiology overread.  Discussed rib contusion and symptomatic treatment with RICE therapy and continuation of over-the-counter ibuprofen or Tylenol.  Advise rest fluids and PCP follow-up as symptoms do not improve.  ER precautions reviewed. Final Clinical Impressions(s) / UC Diagnoses   Final diagnoses:  Rib pain on left side  Contusion of rib on left side, initial encounter     Discharge Instructions      Continue ibuprofen and Tylenol.  Continue cool compresses as well.  Please follow-up with your PCP if your symptoms do not improve.  Please go to the ER for any worsening symptoms.  Hope you feel better soon!     ED Prescriptions   None     PDMP not reviewed this encounter.   Radford Pax, NP 02/28/24 1600

## 2024-02-28 NOTE — ED Triage Notes (Signed)
 Pt was in martial training and got knee' ed on his left side rib cage 2 weeks ago. Pt has taken OTC pain medication for the pain.

## 2024-03-14 ENCOUNTER — Ambulatory Visit
Admission: EM | Admit: 2024-03-14 | Discharge: 2024-03-14 | Disposition: A | Discharge status: Home / Self Care | Attending: Emergency Medicine | Admitting: Emergency Medicine

## 2024-03-14 DIAGNOSIS — J069 Acute upper respiratory infection, unspecified: Secondary | ICD-10-CM | POA: Diagnosis not present

## 2024-03-14 MED ORDER — IPRATROPIUM BROMIDE 0.06 % NA SOLN: 2.0000 | Freq: Four times a day (QID) | NASAL | 12 refills | Status: DC

## 2024-03-14 MED ORDER — PROMETHAZINE-DM 6.25-15 MG/5ML PO SYRP: 5.0000 mL | ORAL_SOLUTION | Freq: Four times a day (QID) | ORAL | 0 refills | Status: DC | PRN

## 2024-03-14 MED ORDER — BENZONATATE 100 MG PO CAPS: 200.0000 mg | ORAL_CAPSULE | Freq: Three times a day (TID) | ORAL | 0 refills | Status: DC

## 2024-03-14 NOTE — Discharge Instructions (Signed)
 You have been diagnosed with a viral URI today.  As we discussed, it could possibly be COVID or influenza.  We did not test you for influenza as you are outside the therapeutic window for Tamiflu.  We did not test you for COVID because it would not change our management.  I do encourage you to mask around others for the next 2 days to prevent any spread of viral illness.  Use over-the-counter Tylenol and/or ibuprofen Corda pack instructions as needed for any fever or pain.  Use the Atrovent nasal spray, 2 squirts in each nostril every 6 hours, as needed for runny nose and postnasal drip.  Use the Tessalon Perles every 8 hours during the day.  Take them with a small sip of water.  They may give you some numbness to the base of your tongue or a metallic taste in your mouth, this is normal.  Use the Promethazine DM cough syrup at bedtime for cough and congestion.  It will make you drowsy so do not take it during the day.  Return for reevaluation or see your primary care provider for any new or worsening symptoms.

## 2024-03-14 NOTE — ED Triage Notes (Signed)
 Pt c/o productive cough, fatigue, nasal congestion, nasal drainage x3days  Pt is unsure if it is allergies.

## 2024-03-14 NOTE — ED Provider Notes (Signed)
 [facility]    CSN: [id number] Arrival date & time: 03/14/24  1112      History   Chief Complaint Chief Complaint  Patient presents with   Cough   Nasal Congestion    HPI Juan Hooper is a 23 y.o. male.   HPI  23 year old male with past medical history significant for mild intermittent asthma, bipolar, ADHD, marijuana use, and tobacco use presents for evaluation of respiratory symptoms that began 3 days ago and consist of nasal congestion and clear nasal discharge, fatigue, cough is intermittently productive for yellow sputum, and 2 separate episodes of loose stools.  He reports that his son has similar symptoms.  He denies fever, ear pain, sore throat, shortness breath, wheezing, nausea, or vomiting.  No recent travel.  Past Medical History:  Diagnosis Date   Asthma     Patient Active Problem List   Diagnosis Date Noted   Diarrhea 01/11/2024   Smoker 01/11/2024   Marijuana use 01/11/2024   ADHD (attention deficit hyperactivity disorder) 12/03/2020   Bipolar disorder (HCC) 12/03/2020   Asthma, mild intermittent 12/28/2011    Past Surgical History:  Procedure Laterality Date   NO PAST SURGERIES     OPEN REDUCTION INTERNAL FIXATION (ORIF) METACARPAL Left 06/14/2018   Procedure: OPEN REDUCTION INTERNAL FIXATION (ORIF) METACARPAL;  Surgeon: Kennedy Bucker, MD;  Location: [facility] ORS;  Service: Orthopedics;  Laterality: Left;   TONSILLECTOMY         Home Medications    Prior to Admission medications   Medication Sig Start Date End Date Taking? Authorizing Provider  benzonatate (TESSALON) 100 MG capsule Take 2 capsules (200 mg total) by mouth every 8 (eight) hours. 03/14/24  Yes Becky Augusta, NP  ipratropium (ATROVENT) 0.06 % nasal spray Place 2 sprays into both nostrils 4 (four) times daily. 03/14/24  Yes Becky Augusta, NP  promethazine-dextromethorphan (PROMETHAZINE-DM) 6.25-15 MG/5ML syrup Take 5 mLs by mouth 4 (four) times daily as needed. 03/14/24  Yes  Becky Augusta, NP  albuterol (VENTOLIN HFA) 108 (90 Base) MCG/ACT inhaler Inhale 1-2 puffs into the lungs every 6 (six) hours as needed for wheezing or shortness of breath.   Yes [provider]    Family History Family History  Problem Relation Age of Onset   Diabetes Mother    Stroke Father     Social History Social History   Tobacco Use   Smoking status: Some Days    Current packs/day: 0.50    Types: Cigars, Cigarettes   Smokeless tobacco: Former  Building services engineer status: Former  Substance Use Topics   Alcohol use: Not Currently   Drug use: Yes    Types: Marijuana     Allergies   Patient has no known allergies.   Review of Systems Review of Systems  Constitutional:  Positive for fatigue. Negative for fever.  HENT:  Positive for congestion and rhinorrhea. Negative for ear pain and sore throat.   Respiratory:  Positive for cough. Negative for shortness of breath and wheezing.   Gastrointestinal:  Positive for diarrhea. Negative for nausea and vomiting.     Physical Exam Triage Vital Signs ED Triage Vitals  Encounter Vitals Group     BP      Systolic BP Percentile      Diastolic BP Percentile      Pulse      Resp      Temp      Temp src      SpO2  Weight      Height      Head Circumference      Peak Flow      Pain Score      Pain Loc      Pain Education      Exclude from Growth Chart    No data found.  Updated Vital Signs BP (!) 143/87 (BP Location: Left Arm)   Pulse (!) 56   Temp 97.8 F (36.6 C) (Oral)   Ht 6' (1.829 m)   Wt 220 lb (99.8 kg)   SpO2 96%   BMI 29.84 kg/m   Visual Acuity Right Eye Distance:   Left Eye Distance:   Bilateral Distance:    Right Eye Near:   Left Eye Near:    Bilateral Near:     Physical Exam Vitals and nursing note reviewed.  Constitutional:      Appearance: Normal appearance. He is not ill-appearing.  HENT:     Head: Normocephalic and atraumatic.     Right Ear: Tympanic membrane, ear  canal and external ear normal. There is no impacted cerumen.     Left Ear: Tympanic membrane, ear canal and external ear normal. There is no impacted cerumen.     Nose: Congestion and rhinorrhea present.     Comments: Nasal mucosa is edematous and erythematous with clear discharge in both nares.    Mouth/Throat:     Mouth: Mucous membranes are moist.     Pharynx: Oropharynx is clear. No oropharyngeal exudate or posterior oropharyngeal erythema.  Cardiovascular:     Rate and Rhythm: Normal rate and regular rhythm.     Pulses: Normal pulses.     Heart sounds: Normal heart sounds. No murmur heard.    No friction rub. No gallop.  Pulmonary:     Effort: Pulmonary effort is normal.     Breath sounds: Normal breath sounds. No wheezing, rhonchi or rales.  Musculoskeletal:     Cervical back: Normal range of motion and neck supple. No tenderness.  Lymphadenopathy:     Cervical: No cervical adenopathy.  Skin:    General: Skin is warm and dry.     Capillary Refill: Capillary refill takes less than 2 seconds.     Findings: No rash.  Neurological:     General: No focal deficit present.     Mental Status: He is alert and oriented to person, place, and time.      UC Treatments / Results  Labs (all labs ordered are listed, but only abnormal results are displayed) Labs Reviewed - No data to display  EKG   Radiology No results found.  Procedures Procedures (including critical care time)  Medications Ordered in UC Medications - No data to display  Initial Impression / Assessment and Plan / UC Course  I have reviewed the triage vital signs and the nursing notes.  Pertinent labs & imaging results that were available during my care of the patient were reviewed by me and considered in my medical decision making (see chart for details).   Patient is a nontoxic-appearing 23 year old male presenting for evaluation of respiratory symptoms as outlined HPI above.  In the exam room he is not in  any acute distress.  He is able to speak in full sentence without dyspnea or tachypnea.  He is afebrile with an oral temp of 97.7.  Oxygen saturation on room air at 96% at triage.  His physical exam does reveal inflammation of his upper respiratory tract as evidenced by  inflamed nasal mucosa with clear rhinorrhea.  Oropharyngeal exam is benign.  No cervical adenopathy present.  Cardiopulmonary exam Nischal lung sounds in all fields.  Patient exam is consistent with a viral URI.  We discussed COVID and influenza testing.  Due to the fact that he has had symptoms for 3 days he is outside the window for antivirals for influenza.  I will discharge patient with a diagnosis of viral URI with a cough with prescription pressure Nasabid up nasal congestion.  Tessalon Perles and Promethazine DM cough syrup for cough and congestion.  He may use over-the-counter Tylenol and ibuprofen according to package instructions as needed for fever or pain.  I will encourage him to mask around others for the next 2 days.  Work note provided.   Final Clinical Impressions(s) / UC Diagnoses   Final diagnoses:  Viral URI with cough     Discharge Instructions      You have been diagnosed with a viral URI today.  As we discussed, it could possibly be COVID or influenza.  We did not test you for influenza as you are outside the therapeutic window for Tamiflu.  We did not test you for COVID because it would not change our management.  I do encourage you to mask around others for the next 2 days to prevent any spread of viral illness.  Use over-the-counter Tylenol and/or ibuprofen Corda pack instructions as needed for any fever or pain.  Use the Atrovent nasal spray, 2 squirts in each nostril every 6 hours, as needed for runny nose and postnasal drip.  Use the Tessalon Perles every 8 hours during the day.  Take them with a small sip of water.  They may give you some numbness to the base of your tongue or a metallic taste in  your mouth, this is normal.  Use the Promethazine DM cough syrup at bedtime for cough and congestion.  It will make you drowsy so do not take it during the day.  Return for reevaluation or see your primary care provider for any new or worsening symptoms.      ED Prescriptions     Medication Sig Dispense Auth. Provider   benzonatate (TESSALON) 100 MG capsule Take 2 capsules (200 mg total) by mouth every 8 (eight) hours. 21 capsule Becky Augusta, NP   ipratropium (ATROVENT) 0.06 % nasal spray Place 2 sprays into both nostrils 4 (four) times daily. 15 mL Becky Augusta, NP   promethazine-dextromethorphan (PROMETHAZINE-DM) 6.25-15 MG/5ML syrup Take 5 mLs by mouth 4 (four) times daily as needed. 118 mL Becky Augusta, NP      PDMP not reviewed this encounter.   Becky Augusta, NP 03/14/24 1148

## 2024-05-17 ENCOUNTER — Ambulatory Visit
Admission: EM | Admit: 2024-05-17 | Discharge: 2024-05-17 | Disposition: A | Discharge status: Home / Self Care | Attending: Emergency Medicine | Admitting: Emergency Medicine

## 2024-05-17 ENCOUNTER — Encounter: Payer: Self-pay | Admitting: Emergency Medicine

## 2024-05-17 DIAGNOSIS — Z113 Encounter for screening for infections with a predominantly sexual mode of transmission: Secondary | ICD-10-CM | POA: Diagnosis not present

## 2024-05-17 LAB — HIV ANTIBODY (ROUTINE TESTING W REFLEX): HIV Screen 4th Generation wRfx: NONREACTIVE

## 2024-05-17 NOTE — Discharge Instructions (Signed)
 Check my chart for results. Avoid sexual activity until results,treatment known and completed. Safe sex with all future sexual activity. We have sent testing for sexually transmitted infections. We will notify you of any positive results once they are received. If required, if HIV is positive,we will refer you to infectious disease.    Follow up with PCP. Return as needed.

## 2024-05-17 NOTE — ED Provider Notes (Signed)
 [facility]    CSN: [id number] Arrival date & time: 05/17/24  1326      History   Chief Complaint Chief Complaint  Patient presents with   Exposure to STD    HPI Juan Hooper is a 23 y.o. male.   23 year old male patient,  Juan Hooper,  presents to urgent care for HIV testing,  patient denies any new partners or symptoms, states he gets this testing done once a year, offered cytology swab and syphilis patient declined.  Patient states he needs HIV test before he has upcoming fight.  Patient also requests work note.  The history is provided by the patient. No language interpreter was used.    Past Medical History:  Diagnosis Date   Asthma     Patient Active Problem List   Diagnosis Date Noted   Routine screening for STI (sexually transmitted infection) 05/17/2024   Diarrhea 01/11/2024   Smoker 01/11/2024   Marijuana use 01/11/2024   ADHD (attention deficit hyperactivity disorder) 12/03/2020   Bipolar disorder (HCC) 12/03/2020   Asthma, mild intermittent 12/28/2011    Past Surgical History:  Procedure Laterality Date   NO PAST SURGERIES     OPEN REDUCTION INTERNAL FIXATION (ORIF) METACARPAL Left 06/14/2018   Procedure: OPEN REDUCTION INTERNAL FIXATION (ORIF) METACARPAL;  Surgeon: Molli Angelucci, MD;  Location: [facility] OR[facility];  Service: Orthopedics;  Laterality: Left;   TONSILLECTOMY         Home Medications    Prior to Admission medications   Medication Sig Start Date End Date Taking? Authorizing Provider  albuterol (VENTOLIN HFA) 108 (90 Base) MCG/ACT inhaler Inhale 1-2 puffs into the lungs every 6 (six) hours as needed for wheezing or shortness of breath.    [provider]  benzonatate  (TESSALON ) 100 MG capsule Take 2 capsules (200 mg total) by mouth every 8 (eight) hours. 03/14/24   Kent Pear, NP  ipratropium (ATROVENT ) 0.06 % nasal spray Place 2 sprays into both nostrils 4 (four) times daily. 03/14/24   Kent Pear, NP   promethazine -dextromethorphan (PROMETHAZINE -DM) 6.25-15 MG/5ML syrup Take 5 mLs by mouth 4 (four) times daily as needed. 03/14/24   Kent Pear, NP    Family History Family History  Problem Relation Age of Onset   Diabetes Mother    Stroke Father     Social History Social History   Tobacco Use   Smoking status: Some Days    Current packs/day: 0.50    Types: Cigars, Cigarettes   Smokeless tobacco: Former  Building services engineer status: Former  Substance Use Topics   Alcohol use: Not Currently   Drug use: Yes    Types: Marijuana     Allergies   Patient has no known allergies.   Review of Systems Review of Systems  Constitutional:  Negative for fever.  Gastrointestinal:  Negative for abdominal pain.  Genitourinary:  Negative for genital sores and penile discharge.  All other systems reviewed and are negative.    Physical Exam Triage Vital Signs ED Triage Vitals  Encounter Vitals Group     BP 05/17/24 1419 (!) 151/78     Girls Systolic BP Percentile --      Girls Diastolic BP Percentile --      Boys Systolic BP Percentile --      Boys Diastolic BP Percentile --      Pulse Rate 05/17/24 1419 (!) 50     Resp 05/17/24 1419 15     Temp 05/17/24 1419 98.2 F (  36.8 C)     Temp Source 05/17/24 1419 Oral     SpO2 05/17/24 1419 98 %     Weight 05/17/24 1417 205 lb (93 kg)     Height 05/17/24 1417 6' (1.829 m)     Head Circumference --      Peak Flow --      Pain Score 05/17/24 1417 0     Pain Loc --      Pain Education --      Exclude from Growth Chart --    No data found.  Updated Vital Signs BP (!) 151/78 (BP Location: Right Arm)   Pulse (!) 50   Temp 98.2 F (36.8 C) (Oral)   Resp 15   Ht 6' (1.829 m)   Wt 205 lb (93 kg)   SpO2 98%   BMI 27.80 kg/m   Visual Acuity Right Eye Distance:   Left Eye Distance:   Bilateral Distance:    Right Eye Near:   Left Eye Near:    Bilateral Near:     Physical Exam Vitals and nursing note reviewed.   Constitutional:      Appearance: He is well-developed and well-groomed.  HENT:     Head: Normocephalic.   Cardiovascular:     Rate and Rhythm: Normal rate and regular rhythm.     Heart sounds: Normal heart sounds.  Pulmonary:     Effort: Pulmonary effort is normal.   Neurological:     General: No focal deficit present.     Mental Status: He is alert and oriented to person, place, and time.     GCS: GCS eye subscore is 4. GCS verbal subscore is 5. GCS motor subscore is 6.   Psychiatric:        Attention and Perception: Attention normal.        Mood and Affect: Mood normal.        Speech: Speech normal.        Behavior: Behavior normal. Behavior is cooperative.      UC Treatments / Results  Labs (all labs ordered are listed, but only abnormal results are displayed) Labs Reviewed  HIV ANTIBODY (ROUTINE TESTING W REFLEX)    EKG   Radiology No results found.  Procedures Procedures (including critical care time)  Medications Ordered in UC Medications - No data to display  Initial Impression / Assessment and Plan / UC Course  I have reviewed the triage vital signs and the nursing notes.  Pertinent labs & imaging results that were available during my care of the patient were reviewed by me and considered in my medical decision making (see chart for details).    Discussed exam findings and plan of care with patient, patient verbalized understanding to this provider will check MyChart for results, patient aware if he test positive he will be notified and referred to infectious disease.  Ddx: Routine STI testing Final Clinical Impressions(s) / UC Diagnoses   Final diagnoses:  Routine screening for STI (sexually transmitted infection)     Discharge Instructions      Check my chart for results. Avoid sexual activity until results,treatment known and completed. Safe sex with all future sexual activity. We have sent testing for sexually transmitted infections. We  will notify you of any positive results once they are received. If required, if HIV is positive,we will refer you to infectious disease.    Follow up with PCP. Return as needed.     ED Prescriptions   None  PDMP not reviewed this encounter.   Peter Brands, NP 05/17/24 2034

## 2024-05-17 NOTE — ED Triage Notes (Signed)
 Patient states that he only wants blood tests for STD tests Patient states that he gets this done once a year.  Patient denies any symptoms.

## 2024-06-18 DIAGNOSIS — S8011XA Contusion of right lower leg, initial encounter: Secondary | ICD-10-CM | POA: Diagnosis not present

## 2024-06-18 DIAGNOSIS — R197 Diarrhea, unspecified: Secondary | ICD-10-CM | POA: Diagnosis not present

## 2024-06-18 DIAGNOSIS — X58XXXA Exposure to other specified factors, initial encounter: Secondary | ICD-10-CM | POA: Diagnosis not present

## 2024-08-26 ENCOUNTER — Ambulatory Visit: Admission: EM | Admit: 2024-08-26 | Discharge: 2024-08-26 | Disposition: A | Discharge status: Home / Self Care

## 2024-08-26 DIAGNOSIS — R519 Headache, unspecified: Secondary | ICD-10-CM

## 2024-08-26 DIAGNOSIS — R1084 Generalized abdominal pain: Secondary | ICD-10-CM

## 2024-08-26 NOTE — ED Provider Notes (Signed)
 [facility]    CSN: [id number] Arrival date & time: 08/26/24  0825      History   Chief Complaint Chief Complaint  Patient presents with   Headache   Abdominal Pain    HPI Juan Hooper is a 23 y.o. male presenting for generalized abdominal cramping and headache that began today.  Patient states he ate chicken and rice for dinner last night and smoked marijuana all day.  He went to bed a couple of hours after he ate.  He denies adding any spices or flavors to the food.  He reports recent change in his diet.  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  Over the past several days he has been back to eating lean meats/proteins.  Patient reports he ate a pretty bad diet last week and had diarrhea intermittently.  He has not had diarrhea since last week.  He says his current abdominal pain is cramping in nature.  Denies any associated fever, nausea/vomiting, blood in stool, constipation, urinary symptoms.  Patient reports similar symptoms when he changed his diet a few months ago and he was seen in this department.  He has not taken any over-the-counter medicines.  He needs a work note for today.  HPI  Past Medical History:  Diagnosis Date   Asthma     Patient Active Problem List   Diagnosis Date Noted   Routine screening for STI (sexually transmitted infection) 05/17/2024   Diarrhea 01/11/2024   Smoker 01/11/2024   Marijuana use 01/11/2024   ADHD (attention deficit hyperactivity disorder) 12/03/2020   Bipolar disorder (HCC) 12/03/2020   Asthma, mild intermittent 12/28/2011    Past Surgical History:  Procedure Laterality Date   NO PAST SURGERIES     OPEN REDUCTION INTERNAL FIXATION (ORIF) METACARPAL Left 06/14/2018   Procedure: OPEN REDUCTION INTERNAL FIXATION (ORIF) METACARPAL;  Surgeon: Kathlynn Sharper, MD;  Location: [facility] ORS;  Service: Orthopedics;  Laterality: Left;   TONSILLECTOMY         Home Medications    Prior to Admission medications    Medication Sig Start Date End Date Taking? Authorizing Provider  albuterol (VENTOLIN HFA) 108 (90 Base) MCG/ACT inhaler Inhale 1-2 puffs into the lungs every 6 (six) hours as needed for wheezing or shortness of breath.    [provider]  benzonatate  (TESSALON ) 100 MG capsule Take 2 capsules (200 mg total) by mouth every 8 (eight) hours. 03/14/24   Bernardino Ditch, NP  ipratropium (ATROVENT ) 0.06 % nasal spray Place 2 sprays into both nostrils 4 (four) times daily. 03/14/24   Bernardino Ditch, NP  promethazine -dextromethorphan (PROMETHAZINE -DM) 6.25-15 MG/5ML syrup Take 5 mLs by mouth 4 (four) times daily as needed. 03/14/24   Bernardino Ditch, NP    Family History Family History  Problem Relation Age of Onset   Diabetes Mother    Stroke Father     Social History Social History   Tobacco Use   Smoking status: Some Days    Current packs/day: 0.50    Types: Cigars, Cigarettes   Smokeless tobacco: Former  Building services engineer status: Former  Substance Use Topics   Alcohol use: Not Currently   Drug use: Yes    Types: Marijuana     Allergies   Patient has no known allergies.   Review of Systems Review of Systems  Constitutional:  Negative for appetite change, fatigue and fever.  HENT:  Negative for congestion, rhinorrhea and sore throat.   Respiratory:  Negative for cough and shortness of breath.   Cardiovascular:  Negative for chest pain.  Gastrointestinal:  Positive for abdominal pain. Negative for abdominal distention, anal bleeding, blood in stool, constipation, diarrhea, nausea, rectal pain and vomiting.  Genitourinary:  Negative for dysuria and flank pain.  Musculoskeletal:  Negative for myalgias.  Neurological:  Positive for headaches. Negative for weakness and light-headedness.     Physical Exam Triage Vital Signs ED Triage Vitals  Encounter Vitals Group     BP 08/26/24 0853 (!) 143/86     Girls Systolic BP Percentile --      Girls Diastolic BP Percentile --       Boys Systolic BP Percentile --      Boys Diastolic BP Percentile --      Pulse Rate 08/26/24 0853 (!) 47     Resp 08/26/24 0853 16     Temp 08/26/24 0853 98.1 F (36.7 C)     Temp Source 08/26/24 0853 Oral     SpO2 08/26/24 0853 97 %     Weight 08/26/24 0852 203 lb (92.1 kg)     Height 08/26/24 0852 6' (1.829 m)     Head Circumference --      Peak Flow --      Pain Score 08/26/24 0856 6     Pain Loc --      Pain Education --      Exclude from Growth Chart --    No data found.  Updated Vital Signs BP (!) 143/86 (BP Location: Right Arm)   Pulse (!) 47   Temp 98.1 F (36.7 C) (Oral)   Resp 16   Ht 6' (1.829 m)   Wt 203 lb (92.1 kg)   SpO2 97%   BMI 27.53 kg/m       Pulse Readings from Last 3 Encounters:  08/26/24 (!) 47  05/17/24 (!) 50  03/14/24 (!) 56     Physical Exam Vitals and nursing note reviewed.  Constitutional:      General: He is not in acute distress.    Appearance: Normal appearance. He is well-developed. He is not ill-appearing.  HENT:     Head: Normocephalic and atraumatic.  Eyes:     General: No scleral icterus.    Conjunctiva/sclera: Conjunctivae normal.  Cardiovascular:     Rate and Rhythm: Regular rhythm. Bradycardia present.  Pulmonary:     Effort: Pulmonary effort is normal. No respiratory distress.     Breath sounds: Normal breath sounds.  Abdominal:     General: Abdomen is flat.     Palpations: Abdomen is soft.     Tenderness: There is no abdominal tenderness. There is no guarding or rebound.  Musculoskeletal:     Cervical back: Neck supple.  Skin:    General: Skin is warm and dry.     Capillary Refill: Capillary refill takes less than 2 seconds.  Neurological:     General: No focal deficit present.     Mental Status: He is alert. Mental status is at baseline.     Motor: No weakness.     Gait: Gait normal.  Psychiatric:        Mood and Affect: Mood normal.        Behavior: Behavior normal.      UC Treatments / Results   Labs (all labs ordered are listed, but only abnormal results are displayed) Labs Reviewed - No data to display  EKG   Radiology No results found.  Procedures Procedures (  including critical care time)  Medications Ordered in UC Medications - No data to display  Initial Impression / Assessment and Plan / UC Course  I have reviewed the triage vital signs and the nursing notes.  Pertinent labs & imaging results that were available during my care of the patient were reviewed by me and considered in my medical decision making (see chart for details).   23 year old male presents for onset of headaches and generalized abdominal cramping this morning upon waking.  Reports having bland food yesterday and smoking a lot of marijuana.  Went to bed a couple of hours after he ate.  Recent change to diet.  Reports he ate really badly last week but is back to eating clean foods and lean meats.  Similar symptoms several months ago when he changed his diet to cut weight for MMA fight.  No fever.  He is afebrile.  Overall well appearing.  No acute distress.  On exam he has no abdominal tenderness.  Chest clear.  Heart regular rate rhythm.  Generalized abdominal cramping.  Offered to prescribe medication but he declines.  Discussed continuing with a healthy diet and reducing marijuana intake.  Advised over-the-counter Tylenol , Pepto-Bismol, rest and fluids.  Reviewed return to ER precautions.  Work note given for today.   Final Clinical Impressions(s) / UC Diagnoses   Final diagnoses:  Generalized abdominal pain  Acute nonintractable headache, unspecified headache type     Discharge Instructions      ABDOMINAL PAIN: You may take Tylenol  for pain relief. Use medications as directed including antiemetics and antidiarrheal medications if suggested or prescribed. You should increase fluids and electrolytes as well as rest over these next several days. If you have any questions or concerns, or if your  symptoms are not improving or if especially if they acutely worsen, please call or stop back to the clinic immediately and we will be happy to help you or go to the ER   ABDOMINAL PAIN RED FLAGS: Seek immediate further care if: symptoms remain the same or worsen over the next 3-7 days, you are unable to keep fluids down, you see blood or mucus in your stool, you vomit black or dark red material, you have a fever of 101.F or higher, you have localized and/or persistent abdominal pain       ED Prescriptions   None    PDMP not reviewed this encounter.   Arvis Jolan NOVAK, PA-C 08/26/24 [phone]

## 2024-08-26 NOTE — ED Triage Notes (Signed)
 Pt c/o abd pain & HA x1 day. Also c/o diarrhea off/on x1 wk. States recently changed diets. No OTC meds.

## 2024-08-26 NOTE — Discharge Instructions (Addendum)

## 2024-10-24 ENCOUNTER — Encounter: Admission: EM | Disposition: A | Payer: Self-pay | Source: Home / Self Care | Attending: Emergency Medicine

## 2024-10-24 ENCOUNTER — Emergency Department

## 2024-10-24 ENCOUNTER — Observation Stay
Admission: EM | Admit: 2024-10-24 | Discharge: 2024-10-25 | Disposition: A | Discharge status: Home / Self Care | Attending: Emergency Medicine | Admitting: Emergency Medicine

## 2024-10-24 ENCOUNTER — Observation Stay: Admitting: Anesthesiology

## 2024-10-24 ENCOUNTER — Ambulatory Visit
Admission: EM | Admit: 2024-10-24 | Discharge: 2024-10-24 | Disposition: A | Discharge status: Home / Self Care | Attending: Family Medicine | Admitting: Family Medicine

## 2024-10-24 ENCOUNTER — Other Ambulatory Visit: Payer: Self-pay

## 2024-10-24 ENCOUNTER — Encounter: Payer: Self-pay | Admitting: Emergency Medicine

## 2024-10-24 DIAGNOSIS — R197 Diarrhea, unspecified: Secondary | ICD-10-CM

## 2024-10-24 DIAGNOSIS — R109 Unspecified abdominal pain: Secondary | ICD-10-CM | POA: Diagnosis not present

## 2024-10-24 DIAGNOSIS — K352 Acute appendicitis with generalized peritonitis, without perforation or abscess: Principal | ICD-10-CM | POA: Insufficient documentation

## 2024-10-24 DIAGNOSIS — K3580 Unspecified acute appendicitis: Secondary | ICD-10-CM | POA: Diagnosis not present

## 2024-10-24 DIAGNOSIS — F17210 Nicotine dependence, cigarettes, uncomplicated: Secondary | ICD-10-CM | POA: Insufficient documentation

## 2024-10-24 DIAGNOSIS — R10813 Right lower quadrant abdominal tenderness: Secondary | ICD-10-CM

## 2024-10-24 DIAGNOSIS — F1290 Cannabis use, unspecified, uncomplicated: Secondary | ICD-10-CM | POA: Insufficient documentation

## 2024-10-24 DIAGNOSIS — R1031 Right lower quadrant pain: Secondary | ICD-10-CM | POA: Diagnosis not present

## 2024-10-24 DIAGNOSIS — J45909 Unspecified asthma, uncomplicated: Secondary | ICD-10-CM | POA: Insufficient documentation

## 2024-10-24 DIAGNOSIS — R1110 Vomiting, unspecified: Secondary | ICD-10-CM | POA: Diagnosis not present

## 2024-10-24 DIAGNOSIS — K353 Acute appendicitis with localized peritonitis, without perforation or gangrene: Secondary | ICD-10-CM | POA: Diagnosis not present

## 2024-10-24 HISTORY — PX: LAPAROSCOPIC APPENDECTOMY: SHX408

## 2024-10-24 LAB — COMPREHENSIVE METABOLIC PANEL WITH GFR
ALT: 19 U/L (ref 0–44)
AST: 16 U/L (ref 15–41)
Albumin: 4.9 g/dL (ref 3.5–5.0)
Alkaline Phosphatase: 94 U/L (ref 38–126)
Anion gap: 11 (ref 5–15)
BUN: 12 mg/dL (ref 6–20)
CO2: 25 mmol/L (ref 22–32)
Calcium: 9.8 mg/dL (ref 8.9–10.3)
Chloride: 101 mmol/L (ref 98–111)
Creatinine, Ser: 0.95 mg/dL (ref 0.61–1.24)
GFR, Estimated: >60 mL/min (ref >60)
Glucose, Bld: 112 mg/dL — ABNORMAL HIGH (ref 70–99)
Potassium: 4.5 mmol/L (ref 3.5–5.1)
Sodium: 138 mmol/L (ref 135–145)
Total Bilirubin: 1 mg/dL (ref 0.0–1.2)
Total Protein: 8.5 g/dL — ABNORMAL HIGH (ref 6.5–8.1)

## 2024-10-24 LAB — URINALYSIS, ROUTINE W REFLEX MICROSCOPIC
Bilirubin Urine: NEGATIVE
Glucose, UA: NEGATIVE mg/dL
Hgb urine dipstick: NEGATIVE
Ketones, ur: 20 mg/dL — AB
Leukocytes,Ua: NEGATIVE
Nitrite: NEGATIVE
Protein, ur: NEGATIVE mg/dL
Specific Gravity, Urine: 1.041 — ABNORMAL HIGH (ref 1.005–1.030)
pH: 5 (ref 5.0–8.0)

## 2024-10-24 LAB — CBC
HCT: 47.4 % (ref 39.0–52.0)
Hemoglobin: 16.2 g/dL (ref 13.0–17.0)
MCH: 29.4 pg (ref 26.0–34.0)
MCHC: 34.2 g/dL (ref 30.0–36.0)
MCV: 86 fL (ref 80.0–100.0)
Platelets: 235 K/uL (ref 150–400)
RBC: 5.51 MIL/uL (ref 4.22–5.81)
RDW: 13 % (ref 11.5–15.5)
WBC: 11.9 K/uL — ABNORMAL HIGH (ref 4.0–10.5)
nRBC: 0 % (ref 0.0–0.2)

## 2024-10-24 LAB — LIPASE, BLOOD: Lipase: 18 U/L (ref 11–51)

## 2024-10-24 SURGERY — APPENDECTOMY, LAPAROSCOPIC
Anesthesia: General

## 2024-10-24 MED ORDER — SODIUM CHLORIDE 0.9 % IV SOLN: INTRAVENOUS | Status: DC

## 2024-10-24 MED ORDER — HYDROMORPHONE HCL 1 MG/ML IJ SOLN: 0.5000 mg | INTRAMUSCULAR | Status: DC | PRN

## 2024-10-24 MED ORDER — PROPOFOL 10 MG/ML IV BOLUS
INTRAVENOUS | Status: DC | PRN
  Administered 2024-10-24: 200 mg via INTRAVENOUS

## 2024-10-24 MED ORDER — KETOROLAC TROMETHAMINE 30 MG/ML IJ SOLN
INTRAMUSCULAR | Status: DC | PRN
  Administered 2024-10-24: 30 mg via INTRAVENOUS

## 2024-10-24 MED ORDER — ONDANSETRON 4 MG PO TBDP: 4.0000 mg | ORAL_TABLET | Freq: Four times a day (QID) | ORAL | Status: DC | PRN

## 2024-10-24 MED ORDER — ONDANSETRON HCL 4 MG/2ML IJ SOLN
INTRAMUSCULAR | Status: DC | PRN
  Administered 2024-10-24: 4 mg via INTRAVENOUS

## 2024-10-24 MED ORDER — CEFAZOLIN SODIUM 1 G IJ SOLR
INTRAMUSCULAR | Status: AC
  Filled 2024-10-24: qty 20

## 2024-10-24 MED ORDER — MORPHINE SULFATE (PF) 2 MG/ML IV SOLN: 2.0000 mg | INTRAVENOUS | Status: DC | PRN

## 2024-10-24 MED ORDER — IOHEXOL 300 MG/ML  SOLN
100.0000 mL | Freq: Once | INTRAMUSCULAR | Status: AC | PRN
  Administered 2024-10-24: 100 mL via INTRAVENOUS

## 2024-10-24 MED ORDER — ROCURONIUM BROMIDE 100 MG/10ML IV SOLN: INTRAVENOUS | Status: DC | PRN

## 2024-10-24 MED ORDER — PIPERACILLIN-TAZOBACTAM 3.375 G IVPB
3.3750 g | Freq: Three times a day (TID) | INTRAVENOUS | Status: DC
  Administered 2024-10-24 – 2024-10-25 (×3): 3.375 g via INTRAVENOUS
  Filled 2024-10-24 (×3): qty 50

## 2024-10-24 MED ORDER — ROCURONIUM BROMIDE 100 MG/10ML IV SOLN
INTRAVENOUS | Status: DC | PRN
  Administered 2024-10-24: 40 mg via INTRAVENOUS

## 2024-10-24 MED ORDER — LIDOCAINE HCL (CARDIAC) PF 100 MG/5ML IV SOSY
PREFILLED_SYRINGE | INTRAVENOUS | Status: DC | PRN
  Administered 2024-10-24: 100 mg via INTRAVENOUS

## 2024-10-24 MED ORDER — ONDANSETRON HCL 4 MG/2ML IJ SOLN
4.0000 mg | Freq: Four times a day (QID) | INTRAMUSCULAR | Status: DC | PRN
Start: 2024-10-24 — End: 2024-10-25

## 2024-10-24 MED ORDER — BUPIVACAINE-EPINEPHRINE (PF) 0.5% -1:200000 IJ SOLN
INTRAMUSCULAR | Status: AC
  Filled 2024-10-24: qty 30

## 2024-10-24 MED ORDER — DEXMEDETOMIDINE HCL IN NACL 80 MCG/20ML IV SOLN
INTRAVENOUS | Status: DC | PRN
  Administered 2024-10-24: 4 ug via INTRAVENOUS
  Administered 2024-10-24 (×2): 8 ug via INTRAVENOUS

## 2024-10-24 MED ORDER — MIDAZOLAM HCL 2 MG/2ML IJ SOLN
INTRAMUSCULAR | Status: AC
  Filled 2024-10-24: qty 2

## 2024-10-24 MED ORDER — OXYCODONE HCL 5 MG PO TABS
ORAL_TABLET | ORAL | Status: AC
  Filled 2024-10-24: qty 1

## 2024-10-24 MED ORDER — ACETAMINOPHEN 500 MG PO TABS: 1000.0000 mg | ORAL_TABLET | Freq: Four times a day (QID) | ORAL | Status: DC

## 2024-10-24 MED ORDER — ACETAMINOPHEN 10 MG/ML IV SOLN
INTRAVENOUS | Status: AC
  Filled 2024-10-24: qty 100

## 2024-10-24 MED ORDER — FENTANYL CITRATE (PF) 100 MCG/2ML IJ SOLN
INTRAMUSCULAR | Status: DC | PRN
  Administered 2024-10-24: 100 ug via INTRAVENOUS

## 2024-10-24 MED ORDER — DEXAMETHASONE SOD PHOSPHATE PF 10 MG/ML IJ SOLN
INTRAMUSCULAR | Status: DC | PRN
  Administered 2024-10-24: 10 mg via INTRAVENOUS

## 2024-10-24 MED ORDER — OXYCODONE HCL 5 MG/5ML PO SOLN: 5.0000 mg | Freq: Once | ORAL | Status: AC | PRN

## 2024-10-24 MED ORDER — BUPIVACAINE-EPINEPHRINE (PF) 0.5% -1:200000 IJ SOLN
INTRAMUSCULAR | Status: DC | PRN
  Administered 2024-10-24: 30 mL via PERINEURAL

## 2024-10-24 MED ORDER — SUGAMMADEX SODIUM 200 MG/2ML IV SOLN
INTRAVENOUS | Status: DC | PRN
  Administered 2024-10-24: 200 mg via INTRAVENOUS

## 2024-10-24 MED ORDER — FENTANYL CITRATE (PF) 100 MCG/2ML IJ SOLN
INTRAMUSCULAR | Status: AC
  Filled 2024-10-24: qty 2

## 2024-10-24 MED ORDER — SODIUM CHLORIDE 0.9 % IV BOLUS
1000.0000 mL | Freq: Once | INTRAVENOUS | Status: AC
  Administered 2024-10-24: 1000 mL via INTRAVENOUS

## 2024-10-24 MED ORDER — ACETAMINOPHEN 10 MG/ML IV SOLN
INTRAVENOUS | Status: DC | PRN
  Administered 2024-10-24: 1000 mg via INTRAVENOUS

## 2024-10-24 MED ORDER — KETOROLAC TROMETHAMINE 30 MG/ML IJ SOLN
INTRAMUSCULAR | Status: AC
  Filled 2024-10-24: qty 1

## 2024-10-24 MED ORDER — SUCCINYLCHOLINE CHLORIDE 200 MG/10ML IV SOSY
PREFILLED_SYRINGE | INTRAVENOUS | Status: DC | PRN
  Administered 2024-10-24: 180 mg via INTRAVENOUS

## 2024-10-24 MED ORDER — OXYCODONE HCL 5 MG PO TABS: 5.0000 mg | ORAL_TABLET | ORAL | Status: DC | PRN

## 2024-10-24 MED ORDER — FENTANYL CITRATE (PF) 100 MCG/2ML IJ SOLN: 25.0000 ug | INTRAMUSCULAR | Status: DC | PRN

## 2024-10-24 MED ORDER — MIDAZOLAM HCL (PF) 2 MG/2ML IJ SOLN
INTRAMUSCULAR | Status: DC | PRN
  Administered 2024-10-24: 2 mg via INTRAVENOUS

## 2024-10-24 MED ORDER — KETOROLAC TROMETHAMINE 30 MG/ML IJ SOLN
30.0000 mg | Freq: Four times a day (QID) | INTRAMUSCULAR | Status: DC
  Administered 2024-10-25: 30 mg via INTRAVENOUS
  Filled 2024-10-24: qty 1

## 2024-10-24 MED ORDER — CEFAZOLIN SODIUM-DEXTROSE 2-3 GM-%(50ML) IV SOLR
INTRAVENOUS | Status: DC | PRN
  Administered 2024-10-24: 2 g via INTRAVENOUS

## 2024-10-24 MED ORDER — OXYCODONE HCL 5 MG PO TABS
5.0000 mg | ORAL_TABLET | Freq: Once | ORAL | Status: AC | PRN
  Administered 2024-10-24: 5 mg via ORAL

## 2024-10-24 SURGICAL SUPPLY — 51 items
BAG PRESSURE INF REUSE 1000 (BAG) IMPLANT
COVER WAND RF STERILE (DRAPES) ×1 IMPLANT
CUTTER FLEX LINEAR 45M (STAPLE) IMPLANT
DEFOGGER SCOPE WARM SEASHARP (MISCELLANEOUS) ×2 IMPLANT
DERMABOND ADVANCED .7 DNX12 (GAUZE/BANDAGES/DRESSINGS) ×2 IMPLANT
DRAPE ARM DVNC X/XI (DISPOSABLE) ×3 IMPLANT
DRAPE COLUMN DVNC XI (DISPOSABLE) ×1 IMPLANT
ELECTRODE CAUTERY BLDE TIP 2.5 (TIP) ×1 IMPLANT
ELECTRODE REM PT RTRN 9FT ADLT (ELECTROSURGICAL) ×2 IMPLANT
FORCEPS BPLR R/ABLATION 8 DVNC (INSTRUMENTS) ×1 IMPLANT
GLOVE SURG SYN 7.0 PF PI (GLOVE) ×3 IMPLANT
GLOVE SURG SYN 7.5 PF PI (GLOVE) ×3 IMPLANT
GOWN STRL REUS W/ TWL LRG LVL3 (GOWN DISPOSABLE) ×5 IMPLANT
GRASPER SUT TROCAR 14GX15 (MISCELLANEOUS) IMPLANT
IRRIGATION STRYKERFLOW (MISCELLANEOUS) IMPLANT
IRRIGATOR SUCT 8 DISP DVNC XI (IRRIGATION / IRRIGATOR) IMPLANT
IV 0.9% NACL 1000 ML (IV SOLUTION) ×1 IMPLANT
KIT PINK PAD W/HEAD ARM REST (MISCELLANEOUS) ×1 IMPLANT
KIT TURNOVER KIT A (KITS) ×1 IMPLANT
LABEL OR SOLS (LABEL) ×1 IMPLANT
LIGASURE LAP MARYLAND 5MM 37CM (ELECTROSURGICAL) IMPLANT
MANIFOLD NEPTUNE II (INSTRUMENTS) ×2 IMPLANT
NDL HYPO 22X1.5 SAFETY MO (MISCELLANEOUS) ×2 IMPLANT
NDL INSUFFLATION 14GA 120MM (NEEDLE) ×1 IMPLANT
NEEDLE HYPO 22X1.5 SAFETY MO (MISCELLANEOUS) ×1 IMPLANT
NEEDLE INSUFFLATION 14GA 120MM (NEEDLE) ×1 IMPLANT
NS IRRIG 500ML POUR BTL (IV SOLUTION) ×1 IMPLANT
OBTURATOR OPTICALSTD 8 DVNC (TROCAR) ×1 IMPLANT
PACK LAP CHOLECYSTECTOMY (MISCELLANEOUS) ×2 IMPLANT
RELOAD STAPLE 45 2.6 WHT THIN (STAPLE) IMPLANT
RELOAD STAPLE 45 3.5 BLU DVNC (STAPLE) IMPLANT
RELOAD STAPLE 45 3.6 BLU REG (STAPLE) ×1 IMPLANT
SCISSORS LAP 5X35 DISP (ENDOMECHANICALS) ×1 IMPLANT
SEAL UNIV 5-12 XI (MISCELLANEOUS) ×3 IMPLANT
SEALER VESSEL EXT DVNC XI (MISCELLANEOUS) ×1 IMPLANT
SET TUBE FILTERED XL AIRSEAL (SET/KITS/TRAYS/PACK) IMPLANT
SET TUBE SMOKE EVAC HIGH FLOW (TUBING) ×1 IMPLANT
SLEEVE ADV FIXATION 5X100MM (TROCAR) ×1 IMPLANT
SOLN STERILE WATER 500 ML (IV SOLUTION) ×2 IMPLANT
SOLUTION ELECTROSURG ANTI STCK (MISCELLANEOUS) ×1 IMPLANT
STAPLER 45 SUREFORM DVNC (STAPLE) IMPLANT
SUT MNCRL AB 4-0 PS2 18 (SUTURE) ×1 IMPLANT
SUT VIC AB 3-0 SH 27X BRD (SUTURE) ×1 IMPLANT
SUT VICRYL 0 UR6 27IN ABS (SUTURE) ×3 IMPLANT
SUTURE MNCRL 4-0 27XMF (SUTURE) ×1 IMPLANT
SYSTEM BAG RETRIEVAL 10MM (BASKET) ×2 IMPLANT
SYSTEM KII FIOS ACES ABD 5X100 (TROCAR) ×1 IMPLANT
TRAP FLUID SMOKE EVACUATOR (MISCELLANEOUS) ×1 IMPLANT
TRAY FOLEY MTR SLVR 16FR STAT (SET/KITS/TRAYS/PACK) ×2 IMPLANT
TROCAR BALLN GELPORT 12X130M (ENDOMECHANICALS) ×1 IMPLANT
TUBING EVAC SMOKE HEATED PNEUM (TUBING) ×1 IMPLANT

## 2024-10-24 NOTE — ED Provider Notes (Addendum)
 [facility]    CSN: [id number] Arrival date & time: 10/24/24  0901      History   Chief Complaint Chief Complaint  Patient presents with   Abdominal Pain    HPI Juan KOHLMANN is a 23 y.o. male.   HPI  Sovereign presents for abdominal pain with diarrhea and vomiting that started on 3 days ago.  He went into work yesterday morning and my world got flipped upside down. He felt like he was running a fever and had chills with significant RLQ abdominal pain. He was told to go home then started vomiting intermittently for a few hours.  Tried a epsom salt bath without relief.  Last night, the fever and vomiting went away.  He was up and down last night.  Has right lower quadrant pain that feels like someone is taking a balloon and pumping it up.  Pain worse with bending or taking a deep breathe.  Laying on his back makes the pain better. Turning to his right makes the pain worse.  Nothing taken for pain.  I dont really do medicine much. He still has his appendix.  His step son had a cold.  Toni says he never gets sick.      Past Surgeries: no previous abdominal surgeries   Symptoms Nausea/Vomiting: yes but resolved last night  Diarrhea: yes but resolved  Constipation: no  Melena/BRBPR: no  Hematemesis: no  Anorexia: no  Fever/Chills: yes  Dysuria: no  Rash: no  Wt loss: no  EtOH use: not in the past 3 years  NSAIDs/ASA: no  Sore throat: no   Cough: yes  Nasal congestion : no  Sleep disturbance: yes  Back Pain: no Headache: no   Past Medical History:  Diagnosis Date   Asthma     Patient Active Problem List   Diagnosis Date Noted   Routine screening for STI (sexually transmitted infection) 05/17/2024   Diarrhea 01/11/2024   Smoker 01/11/2024   Marijuana use 01/11/2024   ADHD (attention deficit hyperactivity disorder) 12/03/2020   Bipolar disorder (HCC) 12/03/2020   Asthma, mild intermittent 12/28/2011    Past Surgical History:  Procedure Laterality  Date   NO PAST SURGERIES     OPEN REDUCTION INTERNAL FIXATION (ORIF) METACARPAL Left 06/14/2018   Procedure: OPEN REDUCTION INTERNAL FIXATION (ORIF) METACARPAL;  Surgeon: Kathlynn Sharper, MD;  Location: [facility] ORS;  Service: Orthopedics;  Laterality: Left;   TONSILLECTOMY         Home Medications    Prior to Admission medications   Medication Sig Start Date End Date Taking? Authorizing Provider  albuterol (VENTOLIN HFA) 108 (90 Base) MCG/ACT inhaler Inhale 1-2 puffs into the lungs every 6 (six) hours as needed for wheezing or shortness of breath.    [provider]    Family History Family History  Problem Relation Age of Onset   Diabetes Mother    Stroke Father     Social History Social History   Tobacco Use   Smoking status: Some Days    Current packs/day: 0.50    Types: Cigars, Cigarettes   Smokeless tobacco: Former  Building Services Engineer status: Former  Substance Use Topics   Alcohol use: Not Currently   Drug use: Yes    Types: Marijuana     Allergies   Patient has no known allergies.   Review of Systems Review of Systems :negative unless otherwise stated in HPI.      Physical Exam Triage Vital Signs  ED Triage Vitals  Encounter Vitals Group     BP 10/24/24 1037 (!) 154/96     Girls Systolic BP Percentile --      Girls Diastolic BP Percentile --      Boys Systolic BP Percentile --      Boys Diastolic BP Percentile --      Pulse Rate 10/24/24 1037 62     Resp --      Temp --      Temp Source 10/24/24 1037 Oral     SpO2 10/24/24 1037 100 %     Weight --      Height --      Head Circumference --      Peak Flow --      Pain Score 10/24/24 1038 10     Pain Loc --      Pain Education --      Exclude from Growth Chart --    No data found.  Updated Vital Signs BP (!) 154/96 (BP Location: Left Arm)   Pulse 62   Temp 97.9 F (36.6 C) (Oral)   Wt 90.2 kg   SpO2 100%   BMI 26.96 kg/m   Visual Acuity Right Eye Distance:   Left Eye  Distance:   Bilateral Distance:    Right Eye Near:   Left Eye Near:    Bilateral Near:     Physical Exam  GEN: non-toxic appearing male, in no acute distress HENT: moist, no tonsillary hypertrophy, no significant erythema, no lesions  CV: regular rate and rhythm RESP: no increased work of breathing, clear to ascultation bilaterally ABD: Bowel sounds present. Soft, mild RUQ and moderate RLQ abdominal tenderness,  non-distended.  No guarding, no rebound MSK: no extremity edema SKIN: warm, dry, no rash on visible skin NEURO: alert, moves all extremities appropriately PSYCH: Normal affect, appropriate speech and behavior   UC Treatments / Results  Labs (all labs ordered are listed, but only abnormal results are displayed) Labs Reviewed - No data to display  EKG  If EKG performed, see my interpretation and MDM section  Radiology No results found.   Procedures Procedures (including critical care time)  Medications Ordered in UC Medications - No data to display  Initial Impression / Assessment and Plan / UC Course  I have reviewed the triage vital signs and the nursing notes.  Pertinent labs & imaging results that were available during my care of the patient were reviewed by me and considered in my medical decision making (see chart for details).       Patient is a  23 y.o. male who presents after having insidious severe right sided abdominal pain with undocumented fever, vomiting and diarrhea. Symptoms started 3 days ago.  Overall, patient is non-toxic-appearing, well-hydrated, and in no acute distress.  He is hypertensive, afebrile and satting well on room air. HR is normal here.  Codyis afebrile. History concerning for an acute abdomen and he has right upper and lower abdominal tenderness on exam.    DDX includes but not limited to: UTI, STI, cholecystitis, pancreatitis, gastroenteritis, nephrolithiasis, constipation, appendicitis    I am unable to rule out appendicitis  here. Recommended ED evaluation for CT ABD/Pelvis and labs and patient agreeable. Urgent care work up truncated.  Called [facility] ED and spoke with triage RN who will await his arrival. Patient to travel via private vehicle to [facility] ED for further evaluation.    Follow-up, return and ED precautions given. Discussed MDM, treatment plan  and plan for follow-up with patient who agrees with plan.    Final Clinical Impressions(s) / UC Diagnoses   Final diagnoses:  Right lower quadrant abdominal tenderness, unspecified whether rebound tenderness present  Abdominal pain, vomiting, and diarrhea     Discharge Instructions      You have been advised to follow up immediately in the emergency department for concerning signs or symptoms as discussed during your visit. If you declined EMS transport, please have a family member take you directly to the ED at this time. Do not delay.   Based on concerns about condition, if you do not follow up in the ED, you may risk poor outcomes including worsening of condition, delayed treatment and potentially life threatening issues. If you have declined to go to the ED at this time, you should call your PCP immediately to set up a follow up appointment.   Go to ED for red flag symptoms, including; fevers you cannot reduce with Tylenol /Motrin , severe headaches, vision changes, numbness/weakness in part of the body, lethargy, confusion, intractable vomiting, severe dehydration, chest pain, breathing difficulty, severe persistent abdominal or pelvic pain, signs of severe infection (increased redness, swelling of an area), feeling faint or passing out, dizziness, etc. You should especially go to the ED for sudden acute worsening of condition if you do not elect to go at this time.        ED Prescriptions   None    PDMP not reviewed this encounter.       Edlin Ford, DO 10/24/24 1114

## 2024-10-24 NOTE — ED Provider Notes (Signed)
 [facility] Provider Note    Event Date/Time   First MD Initiated Contact with Patient 10/24/24 1152     (approximate)   History   Abdominal Pain   HPI  Juan Hooper is a 23 y.o. male history of ADHD, bipolar, asthma presents emergency department with right lower quadrant pain.  Patient states ongoing pain for several days.  Some nausea and vomiting yesterday.  Has had some diarrhea.  Seen at urgent care and sent here for rule out appendicitis.  Patient last ate last night.  Denies fever or chills      Physical Exam   Triage Vital Signs: ED Triage Vitals  Encounter Vitals Group     BP 10/24/24 1124 (!) 165/95     Girls Systolic BP Percentile --      Girls Diastolic BP Percentile --      Boys Systolic BP Percentile --      Boys Diastolic BP Percentile --      Pulse Rate 10/24/24 1124 (!) 58     Resp 10/24/24 1124 18     Temp 10/24/24 1124 97.6 F (36.4 C)     Temp Source 10/24/24 1124 Oral     SpO2 10/24/24 1124 100 %     Weight 10/24/24 1126 200 lb (90.7 kg)     Height 10/24/24 1126 6' (1.829 m)     Head Circumference --      Peak Flow --      Pain Score 10/24/24 1125 7     Pain Loc --      Pain Education --      Exclude from Growth Chart --     Most recent vital signs: Vitals:   10/24/24 1124 10/24/24 1214  BP: (!) 165/95   Pulse: (!) 58   Resp: 18   Temp: 97.6 F (36.4 C)   SpO2: 100% 100%     General: Awake, no distress.   CV:  Good peripheral perfusion.  Resp:  Normal effort. Abd:  No distention.  Tender in the right lower quadrant, positive rebound tenderness Other:     ED Results / Procedures / Treatments   Labs (all labs ordered are listed, but only abnormal results are displayed) Labs Reviewed  COMPREHENSIVE METABOLIC PANEL WITH GFR - Abnormal; Notable for the following components:      Result Value   Glucose, Bld 112 (*)    Total Protein 8.5 (*)    All other components within normal limits  CBC - Abnormal;  Notable for the following components:   WBC 11.9 (*)    All other components within normal limits  URINALYSIS, ROUTINE W REFLEX MICROSCOPIC - Abnormal; Notable for the following components:   Color, Urine YELLOW (*)    APPearance HAZY (*)    Specific Gravity, Urine 1.041 (*)    Ketones, ur 20 (*)    All other components within normal limits  LIPASE, BLOOD     EKG     RADIOLOGY CT abdomen pelvis    PROCEDURES:   Procedures  Critical Care:  no Chief Complaint  Patient presents with   Abdominal Pain      MEDICATIONS ORDERED IN ED: Medications  0.9 %  sodium chloride  infusion (has no administration in time range)  piperacillin -tazobactam (ZOSYN ) IVPB 3.375 g (has no administration in time range)  acetaminophen  (TYLENOL ) tablet 1,000 mg (has no administration in time range)  oxyCODONE  (Oxy IR/ROXICODONE ) immediate release tablet 5-10 mg (has no administration  in time range)  morphine (PF) 2 MG/ML injection 2-4 mg (has no administration in time range)  ondansetron  (ZOFRAN -ODT) disintegrating tablet 4 mg (has no administration in time range)    Or  ondansetron  (ZOFRAN ) injection 4 mg (has no administration in time range)  sodium chloride  0.9 % bolus 1,000 mL (0 mLs Intravenous Stopped 10/24/24 1514)  iohexol (OMNIPAQUE) 300 MG/ML solution 100 mL (100 mLs Intravenous Contrast Given 10/24/24 1401)     IMPRESSION / MDM / ASSESSMENT AND PLAN / ED COURSE  I reviewed the triage vital signs and the nursing notes.                              Differential diagnosis includes, but is not limited to, gastritis, gastroenteritis, acute appendicitis, colitis, diverticulitis, abscess  Patient's presentation is most consistent with acute illness / injury with system symptoms.   CBC with elevated WBC of 11.9, metabolic panel reassuring  CT abdomen pelvis IV contrast for right lower quadrant pain   CT abdomen pelvis IV contrast independently reviewed interpreted by me as being  positive for acute appendicitis  Consults surgery, spoke with Darlyn Blowers, PA-C, he will be admitting the patient for surgery  I had offered the patient pain medication.  He has still refused this time.  Normal saline 1 L was given.  He is stable at time of admission and is in agreement with admission   FINAL CLINICAL IMPRESSION(S) / ED DIAGNOSES   Final diagnoses:  Acute appendicitis with generalized peritonitis without gangrene, perforation, or abscess     Rx / DC Orders   ED Discharge Orders     None        Note:  This document was prepared using Dragon voice recognition software and may include unintentional dictation errors.    Gasper Devere ORN, PA-C 10/24/24 1553    Arlander Charleston, MD 10/24/24 [phone]

## 2024-10-24 NOTE — ED Notes (Signed)
 PARAM[employer]IC LES INFORMED OF BED ASSIGNED

## 2024-10-24 NOTE — ED Triage Notes (Addendum)
 Pt c/o lower right side abdominal pain, vomiting, body chills x3days  Pt states that he has had diarrhea  Pt has tried sitting in epsom salt and drinking water and it did not help his symptoms.   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

## 2024-10-24 NOTE — H&P (Addendum)
 [facility] SURGICAL HISTORY & PHYSICAL (cpt [phone])  HISTORY OF PRESENT ILLNESS (HPI):  23 y.o. male presented to [facility] ED today for abdominal pain. Patient reports he noticed the acute onset of RLQ abdominal pain yesterday morning. He tried to work through this but could not. He ultimately developed nausea, emesis, and chills with the pain. Nothing made this better. He denied any objective fever, cough, SOB, urinary changes, or bowel changes. No history of similar. No previous abdominal surgeries. He does work as a Data Processing Manager. Work up in the ED revealed a leukocytosis to 11.9K otherwise labs were reassuring. CT Abdomen/Pelvis was concerning for acute appendicitis without perforation.   General surgery is consulted by emergency medicine provider Devere Perry, PA-C for evaluation and management of acute appendicitis.   PAST MEDICAL HISTORY (PMH):  Past Medical History:  Diagnosis Date   Asthma     Reviewed. Otherwise negative.   PAST SURGICAL HISTORY (PSH):  Past Surgical History:  Procedure Laterality Date   NO PAST SURGERIES     OPEN REDUCTION INTERNAL FIXATION (ORIF) METACARPAL Left 06/14/2018   Procedure: OPEN REDUCTION INTERNAL FIXATION (ORIF) METACARPAL;  Surgeon: Kathlynn Sharper, MD;  Location: [facility] ORS;  Service: Orthopedics;  Laterality: Left;   TONSILLECTOMY      Reviewed. Otherwise negative.   MEDICATIONS:  Prior to Admission medications   Medication Sig Start Date End Date Taking? Authorizing Provider  albuterol (VENTOLIN HFA) 108 (90 Base) MCG/ACT inhaler Inhale 1-2 puffs into the lungs every 6 (six) hours as needed for wheezing or shortness of breath.    [provider]     ALLERGIES:  No Known Allergies   SOCIAL HISTORY:  Social History   Socioeconomic History   Marital status: Single    Spouse name: Not on file   Number of children: Not on file   Years of education: Not on file   Highest education level: Not on file   Occupational History   Not on file  Tobacco Use   Smoking status: Some Days    Current packs/day: 0.50    Types: Cigars, Cigarettes   Smokeless tobacco: Former  Building Services Engineer status: Former  Substance and Sexual Activity   Alcohol use: Not Currently   Drug use: Yes    Types: Marijuana   Sexual activity: Yes    Partners: Female  Other Topics Concern   Not on file  Social History Narrative   Not on file   Social Drivers of Health   Financial Resource Strain: Low Risk  (04/25/2023)   Received from [facility]   Overall Financial Resource Strain (CARDIA)    Difficulty of Paying Living Expenses: Not hard at all  Food Insecurity: No Food Insecurity (04/25/2023)   Received from [facility]   Hunger Vital Sign    Within the past 12 months, you worried that your food would run out before you got the money to buy more.: Never true    Within the past 12 months, the food you bought just didn't last and you didn't have money to get more.: Never true  Transportation Needs: No Transportation Needs (04/25/2023)   Received from [facility]   PRAPARE - Transportation    Lack of Transportation (Non-Medical): No    In the past 12 months, has lack of transportation kept you from medical appointments or from getting medications?: No  Physical Activity: Not on file  Stress: Not on file  Social Connections: Not on file  Intimate Partner Violence: Not on file     FAMILY HISTORY:  Family History  Problem Relation Age of Onset   Diabetes Mother    Stroke Father     Otherwise negative.   REVIEW OF SYSTEMS:  Review of Systems  Constitutional:  Positive for chills. Negative for fever.  Respiratory:  Negative for cough and shortness of breath.   Cardiovascular:  Negative for chest pain and palpitations.  Gastrointestinal:  Positive for abdominal pain, nausea and vomiting. Negative for constipation and diarrhea.  Genitourinary:   Negative for dysuria and urgency.  All other systems reviewed and are negative.   VITAL SIGNS:  Temp:  [97.6 F (36.4 C)-97.9 F (36.6 C)] 97.6 F (36.4 C) (11/20 1124) Pulse Rate:  [58-62] 58 (11/20 1124) Resp:  [18] 18 (11/20 1124) BP: (154-165)/(95-96) 165/95 (11/20 1124) SpO2:  [100 %] 100 % (11/20 1214) Weight:  [90.2 kg-90.7 kg] 90.7 kg (11/20 1126)     Height: 6' (182.9 cm) Weight: 90.7 kg BMI (Calculated): 27.12   PHYSICAL EXAM:  Physical Exam Vitals and nursing note reviewed. Exam conducted with a chaperone present.  Constitutional:      General: He is not in acute distress.    Appearance: He is well-developed and normal weight. He is not ill-appearing.  HENT:     Head: Normocephalic and atraumatic.  Eyes:     General: No scleral icterus.    Extraocular Movements: Extraocular movements intact.  Cardiovascular:     Rate and Rhythm: Normal rate.     Heart sounds: Normal heart sounds.  Pulmonary:     Effort: Pulmonary effort is normal. No respiratory distress.  Abdominal:     General: Abdomen is flat. There is no distension.     Palpations: Abdomen is soft.     Tenderness: There is abdominal tenderness in the right lower quadrant. There is no guarding or rebound. Positive signs include Rovsing's sign and McBurney's sign.     Comments: Abdomen is soft, he is tender in RLQ, positive McBurney's Point tenderness, positive Rovsing sign, non-distended  Genitourinary:    Comments: Deferred Skin:    General: Skin is warm and dry.  Neurological:     General: No focal deficit present.     Mental Status: He is alert and oriented to person, place, and time.  Psychiatric:        Mood and Affect: Mood normal.        Behavior: Behavior normal.     INTAKE/OUTPUT:  This shift: Total I/O In: 999 [IV Piggyback:999] Out: -   Last 2 shifts: @IOLAST2SHIFTS @  Labs:     Latest Ref Rng & Units 10/24/2024   11:29 AM 10/26/2023    7:38 PM 06/30/2014    4:37 PM  CBC  WBC 4.0 -  10.5 K/uL 11.9  9.6  8.0   Hemoglobin 13.0 - 17.0 g/dL 83.7  83.5  83.4   Hematocrit 39.0 - 52.0 % 47.4  47.8  49.7   Platelets 150 - 400 K/uL 235  231  210       Latest Ref Rng & Units 10/24/2024   11:29 AM 10/26/2023    7:38 PM 06/30/2014    4:37 PM  CMP  Glucose 70 - 99 mg/dL 887  889  67   BUN 6 - 20 mg/dL 12  12  21    Creatinine 0.61 - 1.24 mg/dL 9.04  8.96  9.00   Sodium 135 - 145 mmol/L  138  137  141   Potassium 3.5 - 5.1 mmol/L 4.5  4.2  4.7   Chloride 98 - 111 mmol/L 101  103  100   CO2 22 - 32 mmol/L 25  25  32   Calcium 8.9 - 10.3 mg/dL 9.8  9.4  9.3   Total Protein 6.5 - 8.1 g/dL 8.5  8.0  8.9   Total Bilirubin 0.0 - 1.2 mg/dL 1.0  0.4  0.5   Alkaline Phos 38 - 126 U/L 94  78  174   AST 15 - 41 U/L 16  28  13    ALT 0 - 44 U/L 19  49  26      Imaging studies:   CT Abdomen/Pelvis (10/24/2024) personally reviewed with inflammatory changes about the appendix, no free air, no free fluid, and radiologist report reviewed below:  IMPRESSION: Acute appendicitis. No perforation or abscess.    Assessment/Plan: (ICD-10's: K35.30) 23 y.o. male with acute appendicitis.    - Plan for laparoscopic appendectomy this afternoon with Dr Desiderio pending OR/Anesthesia availability - All risks, benefits, and alternatives to above procedure(s) were discussed with the patient, all of his questions were answered to his expressed satisfaction, patient expresses he wishes to proceed, and informed consent was obtained.    - NPO for planned procedure; IVF support  - IV Abx (Zosyn )  - Monitor abdominal examination  - Pain control prn; antiemetics prn   - DVT prophylaxis; hold for planned OR  All of the above findings and recommendations were discussed with the patient, and all of his questions were answered to his expressed satisfaction.  Patient asked me to speak with his mother Tammy regarding his admission and plan of care. I made effort to call x2 without answer nor voicemail.    -- Arthea Platt, PA-C [facility] 10/24/2024, 3:34 PM M-F: 7am - 4pm

## 2024-10-24 NOTE — ED Notes (Signed)
 Patient is being discharged from the Urgent Care and sent to the Emergency Department via Personal Vehicle . Per Dr. Kriste, patient is in need of higher level of care due to Abdominal tenderness. Patient is aware and verbalizes understanding of plan of care.  Vitals:   10/24/24 1037  BP: (!) 154/96  Pulse: 62  Temp: 97.9 F (36.6 C)  SpO2: 100%

## 2024-10-24 NOTE — Anesthesia Procedure Notes (Signed)
 Procedure Name: Intubation Date/Time: 10/24/2024 9:21 PM  Performed by: Delores Evalene BROCKS, CRNAPre-anesthesia Checklist: Patient identified, Patient being monitored, Timeout performed, Emergency Drugs available and Suction available Patient Re-evaluated:Patient Re-evaluated prior to induction Oxygen Delivery Method: Circle system utilized Preoxygenation: Pre-oxygenation with 100% oxygen Induction Type: Rapid sequence and IV induction Laryngoscope Size: McGrath and 4 Grade View: Grade I Tube type: Oral Tube size: 7.5 mm Number of attempts: 1 Airway Equipment and Method: Stylet Placement Confirmation: ETT inserted through vocal cords under direct vision, positive ETCO2 and breath sounds checked- equal and bilateral Secured at: 23 cm Tube secured with: Tape Dental Injury: Teeth and Oropharynx as per pre-operative assessment

## 2024-10-24 NOTE — ED Triage Notes (Signed)
 Pt to ER with several days of right lower quad abdominal, also reports n/v.  Pt was seen at [facility] UC and they sent him here for further evaluation.

## 2024-10-24 NOTE — Discharge Instructions (Signed)

## 2024-10-24 NOTE — Anesthesia Preprocedure Evaluation (Signed)
 Anesthesia Evaluation  Patient identified by MRN, date of birth, ID band Patient awake    Reviewed: Allergy & Precautions, NPO status , Patient's Chart, lab work & pertinent test results  History of Anesthesia Complications Negative for: history of anesthetic complications  Airway Mallampati: III  TM Distance: <3 FB Neck ROM: full    Dental  (+) Chipped   Pulmonary asthma , sleep apnea , Current Smoker and Patient abstained from smoking.   Pulmonary exam normal        Cardiovascular Exercise Tolerance: Good (-) angina (-) Past MI and (-) DOE negative cardio ROS Normal cardiovascular exam     Neuro/Psych  PSYCHIATRIC DISORDERS      negative neurological ROS     GI/Hepatic negative GI ROS, Neg liver ROS,neg GERD  ,,  Endo/Other  negative endocrine ROS    Renal/GU      Musculoskeletal   Abdominal   Peds  Hematology negative hematology ROS (+)   Anesthesia Other Findings Past Medical History: No date: Asthma  Past Surgical History: No date: NO PAST SURGERIES 06/14/2018: OPEN REDUCTION INTERNAL FIXATION (ORIF) METACARPAL; Left     Comment:  Procedure: OPEN REDUCTION INTERNAL FIXATION (ORIF)               METACARPAL;  Surgeon: Kathlynn Sharper, MD;  Location: [facility]               ORS;  Service: Orthopedics;  Laterality: Left; No date: TONSILLECTOMY  BMI    Body Mass Index: 27.12 kg/m      Reproductive/Obstetrics negative OB ROS                              Anesthesia Physical Anesthesia Plan  ASA: 3  Anesthesia Plan: General ETT   Post-op Pain Management:    Induction: Intravenous  PONV Risk Score and Plan: Ondansetron , Dexamethasone , Midazolam  and Treatment may vary due to age or medical condition  Airway Management Planned: Oral ETT  Additional Equipment:   Intra-op Plan:   Post-operative Plan: Extubation in OR  Informed Consent: I have reviewed the patients History  and Physical, chart, labs and discussed the procedure including the risks, benefits and alternatives for the proposed anesthesia with the patient or authorized representative who has indicated his/her understanding and acceptance.     Dental Advisory Given  Plan Discussed with: Anesthesiologist, CRNA and Surgeon  Anesthesia Plan Comments: (Patient consented for risks of anesthesia including but not limited to:  - adverse reactions to medications - damage to eyes, teeth, lips or other oral mucosa - nerve damage due to positioning  - sore throat or hoarseness - Damage to heart, brain, nerves, lungs, other parts of body or loss of life  Patient voiced understanding and assent.)        Anesthesia Quick Evaluation

## 2024-10-24 NOTE — Op Note (Signed)
 Procedure Date:  10/24/2024  Pre-operative Diagnosis:  Acute appendicitis  Post-operative Diagnosis: Acute appendicitis  Procedure:  Robotic assisted Appendectomy  Surgeon:  Aloysius Sheree Plant, MD  Anesthesia:  General endotracheal  Estimated Blood Loss:  5 ml  Specimens:  Appendix  Complications:  None  Indications for Procedure:  This is a 23 y.o. male who presents with diagnosis of acute appendicitis.  The options of surgery versus medical management were reviewed with the patient and/or family. The risks of bleeding, abscess or infection, recurrence of symptoms, potential for an open procedure, injury to surrounding structures, and chronic pain were all discussed with the patient and he was willing to proceed.  Description of Procedure: The patient was correctly identified in the preoperative area and brought into the operating room.  The patient was placed supine with VTE prophylaxis in place.  Appropriate time-outs were performed.  Anesthesia was induced and the patient was intubated.  Appropriate antibiotics were infused.    The abdomen was prepped and draped in a sterile fashion.  A Veress needle was introduced in the left upper quadrant and pneumoperitoneum was obtained with appropriate pressures.  Using Optiview technique, an 8 mm port was introduced in the left lateral abdominal wall without complications.  Then, a 12 mm port was introduced in the left upper quadrant and an 8 mm port in the left lower quadrant under direct visualization.  The DaVinci platform was docked, camera targeted, and instruments placed under direct visualization.  The right lower quadrant was inspected and the appendix was identified and found to be acutely inflamed.  The appendix was in retrocecal position.  The appendix was carefully dissected.  The mesoappendix was divided using the Vessel Sealer.  The base of the appendix was dissected out and divided with a 45 mm blue load stapler.  The appendix was  placed in an Endocatch bag.  The right lower quadrant was then inspected again revealing an intact staple line, no bleeding, and no bowel injury.  The DaVinci platform was then undocked and instruments removed.    The 12 mm port was removed and the Endocatch Bag retrieved.  30 ml of 0.5% bupivacaine  with epi was infiltrated around the port sites.  The fascia was closed under direct visualization utilizing an Endo Close technique with 0 Vicryl suture.  The 8 mm ports were removed. The 12 mm incision was closed using 3-0 Vicryl and 4-0 Monocryl, and the other port incisions were closed with 4-0 Monocryl.  The wounds were cleaned and sealed with DermaBond.  The patient was emerged from anesthesia and extubated and brought to the recovery room for further management.    The patient tolerated the procedure well and all counts were correct at the end of the case.   Aloysius Sheree Plant, MD

## 2024-10-24 NOTE — Transfer of Care (Signed)
 Immediate Anesthesia Transfer of Care Note  Patient: Juan Hooper  Procedure(s) Performed: APPENDECTOMY, LAPAROSCOPIC, robot assisted  Patient Location: PACU  Anesthesia Type:General  Level of Consciousness: awake, alert , and oriented  Airway & Oxygen Therapy: Patient Spontanous Breathing  Post-op Assessment: Report given to RN and Post -op Vital signs reviewed and stable  Post vital signs: Reviewed and stable  Last Vitals:  Vitals Value Taken Time  BP 156/93 10/24/24 22:47  Temp    Pulse 69 10/24/24 22:51  Resp    SpO2 99 % 10/24/24 22:51  Vitals shown include unfiled device data.  Last Pain:  Vitals:   10/24/24 1713  TempSrc: Oral  PainSc:          Complications: No notable events documented.

## 2024-10-25 ENCOUNTER — Encounter: Payer: Self-pay | Admitting: Surgery

## 2024-10-25 LAB — CBC
HCT: 44.6 % (ref 39.0–52.0)
Hemoglobin: 15.1 g/dL (ref 13.0–17.0)
MCH: 29.5 pg (ref 26.0–34.0)
MCHC: 33.9 g/dL (ref 30.0–36.0)
MCV: 87.1 fL (ref 80.0–100.0)
Platelets: 219 K/uL (ref 150–400)
RBC: 5.12 MIL/uL (ref 4.22–5.81)
RDW: 12.8 % (ref 11.5–15.5)
WBC: 10.8 K/uL — ABNORMAL HIGH (ref 4.0–10.5)
nRBC: 0 % (ref 0.0–0.2)

## 2024-10-25 LAB — BASIC METABOLIC PANEL WITH GFR
Anion gap: 13 (ref 5–15)
BUN: 16 mg/dL (ref 6–20)
CO2: 24 mmol/L (ref 22–32)
Calcium: 9.2 mg/dL (ref 8.9–10.3)
Chloride: 102 mmol/L (ref 98–111)
Creatinine, Ser: 1.03 mg/dL (ref 0.61–1.24)
GFR, Estimated: >60 mL/min (ref >60)
Glucose, Bld: 130 mg/dL — ABNORMAL HIGH (ref 70–99)
Potassium: 4.7 mmol/L (ref 3.5–5.1)
Sodium: 139 mmol/L (ref 135–145)

## 2024-10-25 MED ORDER — OXYCODONE HCL 5 MG PO TABS: 5.0000 mg | ORAL_TABLET | Freq: Four times a day (QID) | ORAL | 0 refills | Status: AC | PRN

## 2024-10-25 MED ORDER — AMOXICILLIN-POT CLAVULANATE 875-125 MG PO TABS: 1.0000 | ORAL_TABLET | Freq: Two times a day (BID) | ORAL | 0 refills | Status: AC

## 2024-10-25 NOTE — Discharge Summary (Signed)
 [facility] SURGICAL DISCHARGE SUMMARY  Patient ID: Juan Hooper MRN: [id number] DOB/AGE: [birth year] 25 y.o.  Admit date: 10/24/2024 Discharge date: 10/25/2024  Discharge Diagnoses Patient Active Problem List   Diagnosis Date Noted   Acute appendicitis 10/24/2024    Consultants None  Procedures 10/24/2024:  Robotic Assisted Laparoscopic Appendectomy   HPI: 23 y.o. male presented to [facility] ED today for abdominal pain. Patient reports he noticed the acute onset of RLQ abdominal pain yesterday morning. He tried to work through this but could not. He ultimately developed nausea, emesis, and chills with the pain. Nothing made this better. He denied any objective fever, cough, SOB, urinary changes, or bowel changes. No history of similar. No previous abdominal surgeries. He does work as a Data Processing Manager. Work up in the ED revealed a leukocytosis to 11.9K otherwise labs were reassuring. CT Abdomen/Pelvis was concerning for acute appendicitis without perforation.   Hospital Course: Informed consent was obtained and documented, and patient underwent uneventful robotic assisted laparoscopic appendectomy (Dr Desiderio, 10/24/2024).  Post-operatively, patient's symptoms improved/resolved and advancement of patient's diet and ambulation were well-tolerated. The remainder of patient's hospital course was essentially unremarkable, and discharge planning was initiated accordingly with patient safely able to be discharged home with appropriate discharge instructions, antibiotics (Augmentin  x7 days), pain control, and outpatient follow-up after all of his questions were answered to his expressed satisfaction.   Discharge Condition: Good   Physical Examination:  Constitutional: Well appearing male; NAD Pulmonary: Normal effort, no respiratory distress Gastrointestinal: Soft, incisional soreness, non-distended, no rebound/guarding Skin: Laparoscopic incisions are CDI with  dermabond, no erythema or drainage    Allergies as of 10/25/2024   No Known Allergies      Medication List     TAKE these medications    albuterol 108 (90 Base) MCG/ACT inhaler Commonly known as: VENTOLIN HFA Inhale 1-2 puffs into the lungs every 6 (six) hours as needed for wheezing or shortness of breath.   amoxicillin -clavulanate 875-125 MG tablet Commonly known as: AUGMENTIN  Take 1 tablet by mouth 2 (two) times daily for 7 days.   oxyCODONE  5 MG immediate release tablet Commonly known as: Oxy IR/ROXICODONE  Take 1 tablet (5 mg total) by mouth every 6 (six) hours as needed for severe pain (pain score 7-10) or breakthrough pain.          Follow-up Information     Eythan Jayne R, PA-C. Go on 11/07/2024.   Specialty: Physician Assistant Why: Go to appointment on 12/04 at 215 PM Contact information: [address] [place] [address] [phone]                  Time spent on discharge management including discussion of hospital course, clinical condition, outpatient instructions, prescriptions, and follow up with the patient and members of the medical team: >30 minutes  -- Arthea Platt , PA-C [facility]  10/25/2024, 8:22 AM [phone] M-F: 7am - 4pm

## 2024-10-25 NOTE — Anesthesia Postprocedure Evaluation (Signed)
 Anesthesia Post Note  Patient: Juan Hooper  Procedure(s) Performed: APPENDECTOMY, LAPAROSCOPIC, robot assisted  Patient location during evaluation: PACU Anesthesia Type: General Level of consciousness: awake and alert Pain management: pain level controlled Vital Signs Assessment: post-procedure vital signs reviewed and stable Respiratory status: spontaneous breathing, nonlabored ventilation and respiratory function stable Cardiovascular status: blood pressure returned to baseline and stable Postop Assessment: no apparent nausea or vomiting Anesthetic complications: no   No notable events documented.   Last Vitals:  Vitals:   10/25/24 0009 10/25/24 0521  BP:  130/73  Pulse: (!) 54 (!) 58  Resp:  16  Temp:  36.6 C  SpO2: 97% 99%    Last Pain:  Vitals:   10/25/24 0521  TempSrc: Oral  PainSc:                  Fairy POUR Paije Goodhart

## 2024-10-25 NOTE — Plan of Care (Signed)

## 2024-10-25 NOTE — Discharge Instructions (Signed)
 In addition to included general post-operative instruction,  Diet: Resume home diet.   Activity: No heavy lifting >20 pounds (children, pets, laundry, garbage) or strenuous activity for 4 weeks, but light activity and walking are encouraged. Do not drive or drink alcohol if taking narcotic pain medications or having pain that might distract from driving.  Wound care: You may shower/get incision wet with soapy water and pat dry (do not rub incisions), but no baths or submerging incision underwater until follow-up.   Medications: Resume all home medications. For mild to moderate pain: acetaminophen  (Tylenol ) or ibuprofen /naproxen  (if no kidney disease). Combining Tylenol  with alcohol can substantially increase your risk of causing liver disease. Narcotic pain medications, if prescribed, can be used for severe pain, though may cause nausea, constipation, and drowsiness. Do not combine Tylenol  and Percocet (or similar) within a 6 hour period as Percocet (and similar) contain(s) Tylenol . If you do not need the narcotic pain medication, you do not need to fill the prescription.  Call office [phone] / [phone]) at any time if any questions, worsening pain, fevers/chills, bleeding, drainage from incision site, or other concerns.

## 2024-10-28 LAB — SURGICAL PATHOLOGY

## 2024-11-07 ENCOUNTER — Encounter: Payer: Self-pay | Admitting: Physician Assistant

## 2024-11-07 ENCOUNTER — Ambulatory Visit: Admitting: Physician Assistant

## 2024-11-07 VITALS — BP 153/91 | HR 76 | Ht 72.0 in | Wt 200.0 lb

## 2024-11-07 DIAGNOSIS — Z09 Encounter for follow-up examination after completed treatment for conditions other than malignant neoplasm: Secondary | ICD-10-CM

## 2024-11-07 DIAGNOSIS — K353 Acute appendicitis with localized peritonitis, without perforation or gangrene: Secondary | ICD-10-CM

## 2024-11-07 DIAGNOSIS — K3580 Unspecified acute appendicitis: Secondary | ICD-10-CM

## 2024-11-07 NOTE — Patient Instructions (Signed)

## 2024-11-07 NOTE — Progress Notes (Signed)
 [facility] POST-OP OFFICE VISIT  11/07/2024  HPI: Juan Hooper is a 23 y.o. male 14 days s/p robotic assisted laparoscopic appendectomy for acute appendicitis with Dr Desiderio   He has done well Took pain medications for 2-3 days; now off pain medications No fever, chills, nausea, emesis, bowel changes He is tolerating PO; no issues Incisions are healing well  Vital signs: BP (!) 153/91   Pulse 76   Ht 6' (1.829 m)   Wt 200 lb (90.7 kg)   SpO2 98%   BMI 27.12 kg/m    Physical Exam: Constitutional: Well appearing male, NAD Abdomen: Soft, non-tender, non-distended, no rebound/guarding Skin: Laparoscopic incisions are healing well, no erythema or drainage   Assessment/Plan: This is a 23 y.o. male 14 days s/p robotic assisted laparoscopic appendectomy for acute appendicitis with Dr Desiderio    - Pain control prn  - Reviewed wound care recommendation  - Reviewed lifting restrictions; 4 weeks total  - Reviewed surgical pathology; Appendicitis  - He can follow up on as needed basis; He understands to call with questions/concerns  -- Arthea Platt, PA-C [facility] 11/07/2024, 2:01 PM M-F: 7am - 4pm
